# Patient Record
Sex: Female | Born: 1965 | Hispanic: Yes | Marital: Married | Smoking: Never smoker
Health system: Southern US, Community
[De-identification: ages and names within clinical notes are randomized; demographics above are authoritative.]

## PROBLEM LIST (undated history)

## (undated) DIAGNOSIS — N63 Unspecified lump in unspecified breast: Secondary | ICD-10-CM

## (undated) DIAGNOSIS — I10 Essential (primary) hypertension: Secondary | ICD-10-CM

## (undated) HISTORY — PX: BREAST EXCISIONAL BIOPSY: SUR124

## (undated) HISTORY — DX: Essential (primary) hypertension: I10

---

## 2011-07-27 HISTORY — PX: ABDOMINAL HYSTERECTOMY: SHX81

## 2015-11-25 ENCOUNTER — Ambulatory Visit (INDEPENDENT_AMBULATORY_CARE_PROVIDER_SITE_OTHER): Payer: Self-pay | Admitting: Emergency Medicine

## 2015-11-25 VITALS — BP 110/86 | HR 74 | Temp 98.1°F | Resp 6 | Ht 62.0 in | Wt 147.0 lb

## 2015-11-25 DIAGNOSIS — N898 Other specified noninflammatory disorders of vagina: Secondary | ICD-10-CM

## 2015-11-25 DIAGNOSIS — Z789 Other specified health status: Secondary | ICD-10-CM

## 2015-11-25 DIAGNOSIS — R1030 Lower abdominal pain, unspecified: Secondary | ICD-10-CM

## 2015-11-25 DIAGNOSIS — A5901 Trichomonal vulvovaginitis: Secondary | ICD-10-CM

## 2015-11-25 LAB — POCT URINALYSIS DIP (MANUAL ENTRY)
BILIRUBIN UA: NEGATIVE
Bilirubin, UA: NEGATIVE
GLUCOSE UA: NEGATIVE
Nitrite, UA: NEGATIVE
PH UA: 6
PROTEIN UA: NEGATIVE
Spec Grav, UA: <=1.005
Urobilinogen, UA: 0.2

## 2015-11-25 LAB — POCT WET + KOH PREP
TRICH BY WET PREP: ABSENT
YEAST BY KOH: ABSENT
YEAST BY WET PREP: ABSENT

## 2015-11-25 LAB — POCT URINE PREGNANCY: Preg Test, Ur: NEGATIVE

## 2015-11-25 NOTE — Progress Notes (Signed)
[facility] [address], [place] [address] [phone]  Date:  11/25/2015   Name:  Messiah Rovira   DOB:  March 14, 1966   MRN:  [id number]  PCP:  No PCP Per Patient    History of Present Illness:  Ellicia Alix is a 50 y.o. female patient who presents to [facility] for cc of abnormal vaginal discharge. Patient is here today with husband.  A teletranslator is used today. Patient complains of abnormal vaginal discharge for 1 week.  It is a yellow discharge.  It is pruritic.  No rash.  No odor.  She feels pain at her both sides of her lower abdomen.  Urine is yellow.  Clearer.  She has some back pain.  No change in appetite.  Last period is 02/08/2013.  She has no vaginal bleeding.     There are no active problems to display for this patient.   Past Medical History  Diagnosis Date  . Hypertension     History reviewed. No pertinent past surgical history.  Social History  Substance Use Topics  . Smoking status: Never Smoker   . Smokeless tobacco: None  . Alcohol Use: No    History reviewed. No pertinent family history.  No Known Allergies  Medication list has been reviewed and updated.  No current outpatient prescriptions on file prior to visit.   No current facility-administered medications on file prior to visit.    ROS ROS otherwise unremarkable unless listed above.  Physical Examination: BP 110/86 mmHg  Pulse 74  Temp(Src) 98.1 F (36.7 C) (Oral)  Resp 6  Ht  (1.575 m)  Wt 147 lb (66.679 kg)  BMI 26.88 kg/m2  SpO2 99% Ideal Body Weight: Weight in (lb) to have BMI = 25: 136.4  Physical Exam  Constitutional: She is oriented to person, place, and time. She appears well-developed and well-nourished. No distress.  HENT:  Head: Normocephalic and atraumatic.  Right Ear: External ear normal.  Left Ear: External ear normal.  Eyes: Conjunctivae and EOM are normal. Pupils are equal, round, and reactive to light.  Cardiovascular: Normal  rate.   Pulmonary/Chest: Effort normal. No respiratory distress.  Genitourinary: Pelvic exam was performed with patient supine. Right adnexum displays no mass and no tenderness. Left adnexum displays no mass and no tenderness. No erythema in the vagina. Vaginal discharge (Yellow) found.  There is a cuffing towards right side consistent with a hysterectomy and removal of cervix at the left side at the end of the vaginal canal.However, this specific area is friable and quite possibly a cervix as cervical bordering is ill defined, and transitions into rugae diminished consistent with atrophic vaginitis.  Neurological: She is alert and oriented to person, place, and time.  Skin: Skin is warm and dry. She is not diaphoretic.  Psychiatric: She has a normal mood and affect. Her behavior is normal.   Results for orders placed or performed in visit on 11/25/15  POCT Wet + KOH Prep  Result Value Ref Range   Yeast by KOH Absent Present, Absent   Yeast by wet prep Absent Present, Absent   WBC by wet prep Too numerous to count  None, Few, Too numerous to count   Clue Cells Wet Prep HPF POC None None, Too numerous to count   Trich by wet prep Absent Present, Absent   Bacteria Wet Prep HPF POC Few None, Few, Too numerous to count   Epithelial Cells By [employer] Pref (UMFC) Moderate (A) None,  Few, Too numerous to count   RBC,UR,HPF,POC None None RBC/hpf  POCT urinalysis dipstick  Result Value Ref Range   Color, UA light yellow (A) yellow   Clarity, UA hazy (A) clear   Glucose, UA negative negative   Bilirubin, UA negative negative   Ketones, POC UA negative negative   Spec Grav, UA <=1.005    Blood, UA trace-intact (A) negative   pH, UA 6.0    Protein Ur, POC negative negative   Urobilinogen, UA 0.2    Nitrite, UA Negative Negative   Leukocytes, UA small (1+) (A) Negative  POCT urine pregnancy  Result Value Ref Range   Preg Test, Ur Negative Negative     Assessment and Plan: Galvin ProfferMaria Vallecillo is a  50 y.o. female who is here today for chief complaint of vaginal discharge abnormality. We have ordered a Pap this time.  Lower abdominal pain - Plan: Pap IG, CT/NG w/ reflex HPV when ASC-U, POCT urine pregnancy  Vaginal discharge - Plan: POCT Wet + KOH Prep, POCT urinalysis dipstick, CANCELED: Pap IG w/ reflex to HPV when ASC-U  Language barrier  Trena PlattStephanie English, PA-C [facility]  Addendum: Teletranslator utilized to place a message of positive trichomoniasis from pap smear.  Advised of dosage, partner testing, and abstaining for at least 1 week after treatment and that partner must be treated.   I stated that their are odd cases where this pathogen may lay dormant for a lengthy time prior to symptoms.  Advised to contact for questions. Pap is also satisfactor for endocervical sample, which possibly indicates remnants of cervix at excision site, or this was in fact, her cervix.

## 2015-11-25 NOTE — Patient Instructions (Addendum)
I will be in contact with you in regards to your results.   We recommend that you schedule a mammogram for breast cancer screening. Typically, you do not need a referral to do this. Please contact a local imaging center to schedule your mammogram.  [facility] - [phone]  *ask for the Radiology Department [facility]) - [phone] or [phone]  [facility] - [phone] [facility] - [phone] [facility] - [phone]  *ask for the Radiology Department [facility] - [phone]  *ask for the Radiology Department [facility] - [phone]  *ask for the Mammography Department [facility] - [phone]   We recommend that you schedule a mammogram for breast cancer screening. Typically, you do not need a referral to do this. Please contact a local imaging center to schedule your mammogram.  [facility] - [phone]  *ask for the Radiology Department [facility]) - [phone] or [phone]  [facility] - [phone] [facility] - [phone] [facility] - [phone]  *ask for the Radiology Department [facility] - [phone]) [phone]  *ask for the Radiology Department [facility] - [phone]  *ask for the Mammography Department [facility] - [phone]

## 2015-11-27 LAB — PAP IG, CT-NG, RFX HPV ASCU
CHLAMYDIA PROBE AMP: NOT DETECTED
GC PROBE AMP: NOT DETECTED

## 2015-11-27 MED ORDER — METRONIDAZOLE 500 MG PO TABS: 1000.0000 mg | ORAL_TABLET | Freq: Two times a day (BID) | ORAL | Status: AC

## 2016-08-03 LAB — HM MAMMOGRAPHY

## 2017-05-24 ENCOUNTER — Encounter: Payer: Self-pay | Admitting: Family Medicine

## 2017-05-24 ENCOUNTER — Ambulatory Visit (INDEPENDENT_AMBULATORY_CARE_PROVIDER_SITE_OTHER): Payer: Self-pay | Admitting: Family Medicine

## 2017-05-24 VITALS — BP 122/76 | HR 82 | Temp 98.5°F | Resp 17 | Ht 62.0 in | Wt 142.6 lb

## 2017-05-24 DIAGNOSIS — I10 Essential (primary) hypertension: Secondary | ICD-10-CM | POA: Insufficient documentation

## 2017-05-24 MED ORDER — AMLODIPINE BESYLATE 5 MG PO TABS: 5.0000 mg | ORAL_TABLET | Freq: Every day | ORAL | 11 refills | Status: DC

## 2017-05-24 NOTE — Patient Instructions (Signed)
1. empezar con media tableta una vez al dia 2. venir en 2 semanas a chequearse la presion con el enfermero 3. si la presion esta > 140/90, entonces tomarse una pastilla al dia

## 2017-05-24 NOTE — Progress Notes (Signed)
   10/30/20184:39 PM  Linda ProfferMaria Henry Sep 16, 1965, 51 y.o. female [id number]  Chief Complaint  Patient presents with  . Medication Refill    lisinopril    HPI:   Patient is a 51 y.o. female with past medical history significant for HTN who presents today for med refill.  She used to take hctz 12.5mg  however this was changed to lisinorpil 10mg  due to hypokalemia  She is wondering if current dry cough this from lisinopril, as she normally does not have issues with cough, denies any URI or allergy sx.   [facility] 2/9 05/24/2017 11/25/2015  Decreased Interest 0 0  Down, Depressed, Hopeless 0 0  PHQ - 2 Score 0 0    No Known Allergies  Prior to Admission medications   Medication Sig Start Date End Date Taking? Authorizing Provider  Lisinopril 10mg  Take 1 tablet (10 mg total) by mouth daily. 05/24/17   Myles LippsSantiago, Aundray Cartlidge M, MD    Past Medical History:  Diagnosis Date  . Hypertension     Past Surgical History:  Procedure Laterality Date  . ABDOMINAL HYSTERECTOMY  2013   partial for benign resons, ovaries not removed, [place]    Social History  Substance Use Topics  . Smoking status: Never Smoker  . Smokeless tobacco: Never Used  . Alcohol use No    History reviewed. No pertinent family history.  Review of Systems  Constitutional: Negative for chills and fever.  Respiratory: Negative for cough and shortness of breath.   Cardiovascular: Negative for chest pain, palpitations and leg swelling.  Gastrointestinal: Negative for abdominal pain, nausea and vomiting.     OBJECTIVE:  Blood pressure 122/76, pulse 82, temperature 98.5 F (36.9 C), temperature source Oral, resp. rate 17, height 5\' 2"  (1.575 m), weight 142 lb 9.6 oz (64.7 kg), SpO2 100 %.  Physical Exam  Constitutional: She is oriented to person, place, and time and well-developed, well-nourished, and in no distress.  HENT:  Head: Normocephalic and atraumatic.  Mouth/Throat: Oropharynx is clear  and moist. No oropharyngeal exudate.  Eyes: Pupils are equal, round, and reactive to light. EOM are normal. No scleral icterus.  Neck: Neck supple.  Cardiovascular: Normal rate, regular rhythm and normal heart sounds.  Exam reveals no gallop and no friction rub.   No murmur heard. Pulmonary/Chest: Effort normal and breath sounds normal. She has no wheezes. She has no rales.  Musculoskeletal: She exhibits no edema.  Neurological: She is alert and oriented to person, place, and time. Gait normal.  Skin: Skin is warm and dry.     ASSESSMENT and PLAN  1. Essential hypertension,  DC lisinopril as cough might be ACE induced. Start amlodipine at 1/2 tab (2.5mg ) once a day, recheck BP in 2 weeks with nurse visit. If BP > 140/90 then increase to 1 tab (5mg ) once a day. New med r/se/b reviewed. Coupon for [employer] given. Hand rx given. RTC precautions discussed. - Basic metabolic panel - amLODipine (NORVASC) 5 MG tablet; Take 1 tablet (5 mg total) by mouth daily.  No Follow-up on file.    Myles LippsIrma M Santiago, MD [facility] at [facility] [address] [place], [place] [address] Ph.  [phone] Fax [phone]

## 2017-05-25 LAB — BASIC METABOLIC PANEL
BUN/Creatinine Ratio: 13 (ref 9–23)
BUN: 11 mg/dL (ref 6–24)
CO2: 23 mmol/L (ref 20–29)
Calcium: 9 mg/dL (ref 8.7–10.2)
Chloride: 103 mmol/L (ref 96–106)
Creatinine, Ser: 0.87 mg/dL (ref 0.57–1.00)
GFR calc Af Amer: 89 mL/min/{1.73_m2} (ref >59)
GFR calc non Af Amer: 77 mL/min/{1.73_m2} (ref >59)
Glucose: 89 mg/dL (ref 65–99)
Potassium: 4 mmol/L (ref 3.5–5.2)
Sodium: 141 mmol/L (ref 134–144)

## 2017-07-26 DIAGNOSIS — N63 Unspecified lump in unspecified breast: Secondary | ICD-10-CM

## 2017-07-26 HISTORY — DX: Unspecified lump in unspecified breast: N63.0

## 2017-08-23 ENCOUNTER — Encounter: Payer: Self-pay | Admitting: Family Medicine

## 2017-08-23 ENCOUNTER — Ambulatory Visit (INDEPENDENT_AMBULATORY_CARE_PROVIDER_SITE_OTHER): Payer: 59 | Admitting: Family Medicine

## 2017-08-23 ENCOUNTER — Other Ambulatory Visit: Payer: Self-pay

## 2017-08-23 VITALS — BP 126/70 | HR 64 | Temp 98.2°F | Ht 62.0 in | Wt 145.0 lb

## 2017-08-23 DIAGNOSIS — I10 Essential (primary) hypertension: Secondary | ICD-10-CM

## 2017-08-23 DIAGNOSIS — Z23 Encounter for immunization: Secondary | ICD-10-CM | POA: Diagnosis not present

## 2017-08-23 DIAGNOSIS — Z1231 Encounter for screening mammogram for malignant neoplasm of breast: Secondary | ICD-10-CM

## 2017-08-23 MED ORDER — LISINOPRIL 10 MG PO TABS: 10.0000 mg | ORAL_TABLET | Freq: Every day | ORAL | 3 refills | Status: DC

## 2017-08-23 NOTE — Progress Notes (Signed)
   1/29/20199:04 AM  Galvin ProfferMaria Vallecillo 15-Jul-1966, 52 y.o. female [id number]  Chief Complaint  Patient presents with  . Follow-up    BP FOLLOW UP    HPI:   Patient is a 52 y.o. female with past medical history significant for HTN who presents today for BP check  Cough did not resolve after stopping lisinopril, she would like to return to lisinopril as her BP was well controlled on it in the past and medication is cheaper  She is also requesting referral for mammogram Last mammo a year ago, in [place], denies abnormal, denies any breast lumps of nipple discharge Her sister was recently diagnosed with breast cancer  [facility] 08/23/2017 05/24/2017 11/25/2015  Decreased Interest 0 0 0  Down, Depressed, Hopeless 0 0 0  PHQ - 2 Score 0 0 0    No Known Allergies  Prior to Admission medications   Medication Sig Start Date End Date Taking? Authorizing Provider  amLODipine (NORVASC) 5 MG tablet Take 1 tablet (5 mg total) by mouth daily. 05/24/17  Yes Myles LippsSantiago, Raelin Pixler M, MD    Past Medical History:  Diagnosis Date  . Hypertension     Past Surgical History:  Procedure Laterality Date  . ABDOMINAL HYSTERECTOMY  2013   partial for benign resons, ovaries not removed, [place]    Social History   Tobacco Use  . Smoking status: Never Smoker  . Smokeless tobacco: Never Used  Substance Use Topics  . Alcohol use: No    Alcohol/week: 0.0 oz    Family History  Problem Relation Age of Onset  . Healthy Mother   . Healthy Father     ROS Per hpi  OBJECTIVE:  Blood pressure 126/70, pulse 64, temperature 98.2 F (36.8 C), temperature source Oral, height 5\' 2"  (1.575 m), weight 145 lb (65.8 kg), SpO2 97 %.  Physical Exam  Constitutional: She is oriented to person, place, and time and well-developed, well-nourished, and in no distress.  HENT:  Head: Normocephalic and atraumatic.  Mouth/Throat: Mucous membranes are normal.  Eyes: EOM are normal.  Pupils are equal, round, and reactive to light. No scleral icterus.  Neck: Neck supple.  Pulmonary/Chest: Effort normal.  Neurological: She is alert and oriented to person, place, and time. Gait normal.  Skin: Skin is warm and dry.  Psychiatric: Mood and affect normal.  Nursing note and vitals reviewed.  Bmet, 04/2017, normal   ASSESSMENT and PLAN  1. Essential hypertension, benign Patient is well controlled, changing back to lisinopril per patient's request.    2. Visit for screening mammogram - MM DIGITAL SCREENING BILATERAL; Future  3. Need for prophylactic vaccination with combined diphtheria-tetanus-pertussis (DTP) vaccine - Td vaccine greater than or equal to 7yo preservative free IM  4. Need for prophylactic vaccination and inoculation against influenza - Flu Vaccine QUAD 36+ mos IM  Other orders - lisinopril (PRINIVIL,ZESTRIL) 10 MG tablet; Take 1 tablet (10 mg total) by mouth daily.  Return in about 6 months (around 02/20/2018).    Myles LippsIrma M Santiago, MD [facility] at [facility] [address] [place], [place] [address] Ph.  [phone] Fax [phone]

## 2017-08-23 NOTE — Patient Instructions (Signed)
     IF you received an x-ray today, you will receive an invoice from [facility]. Please contact [facility] at [phone] with questions or concerns regarding your invoice.   IF you received labwork today, you will receive an invoice from [facility]. Please contact [facility] at [phone] with questions or concerns regarding your invoice.   Our billing staff will not be able to assist you with questions regarding bills from these companies.  You will be contacted with the lab results as soon as they are available. The fastest way to get your results is to activate your My Chart account. Instructions are located on the last page of this paperwork. If you have not heard from us regarding the results in 2 weeks, please contact this office.     

## 2018-02-10 ENCOUNTER — Ambulatory Visit: Payer: 59 | Admitting: Family Medicine

## 2018-02-14 ENCOUNTER — Other Ambulatory Visit: Payer: Self-pay

## 2018-02-14 ENCOUNTER — Ambulatory Visit: Payer: BLUE CROSS/BLUE SHIELD | Admitting: Family Medicine

## 2018-02-14 ENCOUNTER — Encounter: Payer: Self-pay | Admitting: Family Medicine

## 2018-02-14 ENCOUNTER — Ambulatory Visit: Payer: 59 | Admitting: Family Medicine

## 2018-02-14 VITALS — BP 112/72 | HR 83 | Temp 99.1°F | Resp 18 | Ht 62.0 in | Wt 142.0 lb

## 2018-02-14 DIAGNOSIS — Z13220 Encounter for screening for lipoid disorders: Secondary | ICD-10-CM | POA: Diagnosis not present

## 2018-02-14 DIAGNOSIS — Z130 Encounter for screening for diseases of the blood and blood-forming organs and certain disorders involving the immune mechanism: Secondary | ICD-10-CM

## 2018-02-14 DIAGNOSIS — Z0000 Encounter for general adult medical examination without abnormal findings: Secondary | ICD-10-CM | POA: Diagnosis not present

## 2018-02-14 DIAGNOSIS — Z13228 Encounter for screening for other metabolic disorders: Secondary | ICD-10-CM | POA: Diagnosis not present

## 2018-02-14 DIAGNOSIS — Z1231 Encounter for screening mammogram for malignant neoplasm of breast: Secondary | ICD-10-CM

## 2018-02-14 DIAGNOSIS — Z119 Encounter for screening for infectious and parasitic diseases, unspecified: Secondary | ICD-10-CM

## 2018-02-14 DIAGNOSIS — Z1211 Encounter for screening for malignant neoplasm of colon: Secondary | ICD-10-CM

## 2018-02-14 DIAGNOSIS — Z1329 Encounter for screening for other suspected endocrine disorder: Secondary | ICD-10-CM

## 2018-02-14 MED ORDER — LISINOPRIL 10 MG PO TABS: 10.0000 mg | ORAL_TABLET | Freq: Every day | ORAL | 3 refills | Status: DC

## 2018-02-14 NOTE — Progress Notes (Signed)
7/23/20192:17 PM  Linda Henry 1966-05-03, 52 y.o. female [id number]  Chief Complaint  Patient presents with  . Breast Problem    follow up     HPI:   Patient is a 52 y.o. female with past medical history significant for HTN who presents today for CPE  Cervical Cancer Screening: supracervical hyst, last pap 11/2015, negative Breast Cancer Screening: 07/2016, birads 1, done in [place], brings report for review Colorectal Cancer Screening: referred today, denies fhx Bone Density Testing: at age 81 HIV Screening: done today Seasonal Influenza Vaccination: will need this season Td/Tdap Vaccination: given jan 2019 Pneumococcal Vaccination: at age 54 Zoster Vaccination: declines today Frequency of Dental evaluation: does in [place] Frequency of Eye evaluation: does in [place]  Fall Risk  08/23/2017 05/24/2017 11/25/2015  Falls in the past year? No No No     Depression screen [facility] 2/9 08/23/2017 05/24/2017 11/25/2015  Decreased Interest 0 0 0  Down, Depressed, Hopeless 0 0 0  PHQ - 2 Score 0 0 0    No Known Allergies  Prior to Admission medications   Medication Sig Start Date End Date Taking? Authorizing Provider  lisinopril (PRINIVIL,ZESTRIL) 10 MG tablet Take 1 tablet (10 mg total) by mouth daily. 08/23/17  Yes Myles Lipps, MD    Past Medical History:  Diagnosis Date  . Hypertension     Past Surgical History:  Procedure Laterality Date  . ABDOMINAL HYSTERECTOMY  2013   partial, for benign resons, cervix and ovaries not removed, [place]    Social History   Tobacco Use  . Smoking status: Never Smoker  . Smokeless tobacco: Never Used  Substance Use Topics  . Alcohol use: No    Alcohol/week: 0.0 oz    Family History  Problem Relation Age of Onset  . Healthy Mother   . Healthy Father     Review of Systems  Constitutional: Negative for chills and fever.  Respiratory: Negative for cough and shortness of breath.   Cardiovascular: Negative for chest  pain, palpitations and leg swelling.  Gastrointestinal: Negative for abdominal pain, nausea and vomiting.  Genitourinary: Negative for dysuria and hematuria.       Neg breast lumps or nipple discharge   Musculoskeletal: Negative for falls and myalgias.  All other systems reviewed and are negative.    OBJECTIVE:  Blood pressure 112/72, pulse 83, temperature 99.1 F (37.3 C), temperature source Oral, resp. rate 18, height 5\' 2"  (1.575 m), weight 142 lb (64.4 kg), SpO2 99 %. Body mass index is 25.97 kg/m.    Visual Acuity Screening   Right eye Left eye Both eyes  Without correction: 20/25 20/25 20/20   With correction:       Physical Exam  Constitutional: She is oriented to person, place, and time. She appears well-developed and well-nourished.  HENT:  Head: Normocephalic and atraumatic.  Right Ear: Hearing, tympanic membrane, external ear and ear canal normal.  Left Ear: Hearing, tympanic membrane, external ear and ear canal normal.  Mouth/Throat: Oropharynx is clear and moist.  Eyes: Pupils are equal, round, and reactive to light. Conjunctivae and EOM are normal.  Neck: Neck supple. No thyromegaly present.  Cardiovascular: Normal rate, regular rhythm, normal heart sounds and intact distal pulses. Exam reveals no gallop and no friction rub.  No murmur heard. Pulmonary/Chest: Effort normal and breath sounds normal. She has no wheezes. She has no rales. Right breast exhibits no inverted nipple, no mass, no nipple discharge and no skin change. Left breast exhibits no  inverted nipple, no mass, no nipple discharge and no skin change. Breasts are symmetrical.  Abdominal: Soft. Bowel sounds are normal. She exhibits no distension and no mass. There is no tenderness.  Musculoskeletal: Normal range of motion. She exhibits no edema.  Lymphadenopathy:    She has no cervical adenopathy.    She has no axillary adenopathy.       Right: No supraclavicular adenopathy present.       Left: No  supraclavicular adenopathy present.  Neurological: She is alert and oriented to person, place, and time. She has normal reflexes. No cranial nerve deficit. Gait normal.  Skin: Skin is warm and dry.  Psychiatric: She has a normal mood and affect.  Nursing note and vitals reviewed.   ASSESSMENT and PLAN  1. Routine general medical examination at a health care facility No concerns per history or exam. Routine HCM labs ordered. HCM reviewed/discussed. Anticipatory guidance regarding healthy weight, lifestyle and choices given.   2. Visit for screening mammogram - Mammogram Digital Screening; Future  3. Screening for colon cancer - Ambulatory referral to Gastroenterology  4. Screening for deficiency anemia - CBC  5. Screening for lipid disorders - Lipid panel  6. Screening for thyroid disorder - TSH  7. Screening for metabolic disorder - Comprehensive metabolic panel  8. Screening examination for infectious disease - HIV antibody (with reflex)  Other orders - lisinopril (PRINIVIL,ZESTRIL) 10 MG tablet; Take 1 tablet (10 mg total) by mouth daily.  Return in 6 months (on 08/17/2018).    Myles LippsIrma M Santiago, MD [facility] Care at [facility] [address] [place], [place] [address] Ph.  [phone] Fax [phone]

## 2018-02-14 NOTE — Patient Instructions (Addendum)
   IF you received an x-ray today, you will receive an invoice from [facility]. Please contact [facility] at [phone] with questions or concerns regarding your invoice.   IF you received labwork today, you will receive an invoice from [facility]. Please contact [facility] at [phone] with questions or concerns regarding your invoice.   Our billing staff will not be able to assist you with questions regarding bills from these companies.  You will be contacted with the lab results as soon as they are available. The fastest way to get your results is to activate your My Chart account. Instructions are located on the last page of this paperwork. If you have not heard from us regarding the results in 2 weeks, please contact this office.     Mantenimiento de la salud - Mujeres (Health Maintenance, Female) Un estilo de vida saludable y los cuidados preventivos pueden favorecer considerablemente a la salud y el bienestar. Pregunte a su mdico cul es el cronograma de exmenes peridicos apropiado para usted. Esta es una buena oportunidad para consultarlo sobre cmo prevenir enfermedades y mantenerse sano. Adems de los controles, hay muchas otras cosas que puede hacer usted mismo. Los expertos han realizado numerosas investigaciones sobre los cambios en el estilo de vida y las medidas de prevencin que, muy probablemente, lo ayudarn a mantenerse sano. Solicite a su mdico ms informacin. EL PESO Y LA DIETA Consuma una dieta saludable.  Asegrese de incluir muchas verduras, frutas, productos lcteos de bajo contenido de grasa y protenas magras.  No consuma muchos alimentos de alto contenido de grasas slidas, azcares agregados o sal.  Realice actividad fsica con regularidad. Esta es una de las prcticas ms importantes que puede hacer por su salud. ? La mayora de los adultos deben hacer ejercicio durante al menos 150minutos por semana. El ejercicio debe aumentar la  frecuencia cardaca y provocar la transpiracin (ejercicio de intensidad moderada). ? La mayora de los adultos tambin deben hacer ejercicios de elongacin al menos dos veces a la semana. Agregue esto al su plan de ejercicio de intensidad moderada. Mantenga un peso saludable.  El ndice de masa corporal (IMC) es una medida que puede utilizarse para identificar posibles problemas de peso. Proporciona una estimacin de la grasa corporal basndose en el peso y la altura. Su mdico puede ayudarle a determinar su IMC y a lograr o mantener un peso saludable.  Para las mujeres de 20aos o ms: ? Un IMC menor de 18,5 se considera bajo peso. ? Un IMC entre 18,5 y 24,9 es normal. ? Un IMC entre 25 y 29,9 se considera sobrepeso. ? Un IMC de 30 o ms se considera obesidad. Observe los niveles de colesterol y lpidos en la sangre.  Debe comenzar a realizarse anlisis de lpidos y colesterol en la sangre a los 20aos y luego repetirlos cada 5aos.  Es posible que necesite controlar los niveles de colesterol con mayor frecuencia si: ? Sus niveles de lpidos y colesterol son altos. ? Es mayor de 50aos. ? Presenta un alto riesgo de padecer enfermedades cardacas. DETECCIN DE CNCER Cncer de pulmn  Se recomienda realizar exmenes de deteccin de cncer de pulmn a personas adultas entre 55 y 80 aos que estn en riesgo de desarrollar cncer de pulmn por sus antecedentes de consumo de tabaco.  Se recomienda una tomografa computarizada de baja dosis de los pulmones todos los aos a las personas que: ? Fuman actualmente. ? Hayan dejado el hbito en algn momento en los ltimos 15aos. ?   Hayan fumado durante 30aos un paquete diario. Un paquete-ao equivale a fumar un promedio de un paquete de cigarrillos diario durante un ao.  Los exmenes de deteccin anuales deben continuar hasta que hayan pasado 15aos desde que dej de fumar.  Ya no debern realizarse si tiene un problema de salud que le  impida recibir tratamiento para el cncer de pulmn. Cncer de mama  Practique la autoconciencia de la mama. Esto significa reconocer la apariencia normal de sus mamas y cmo las siente.  Tambin significa realizar autoexmenes regulares de las mamas. Informe a su mdico sobre cualquier cambio, sin importar cun pequeo sea.  Si tiene entre 20 y 30 aos, un mdico debe realizarle un examen clnico de las mamas como parte del examen regular de salud, cada 1 a 3aos.  Si tiene 40aos o ms, debe realizarse un examen clnico de las mamas todos los aos. Tambin considere realizarse una radiografa de las mamas (mamografa) todos los aos.  Si tiene antecedentes familiares de cncer de mama, hable con su mdico para someterse a un estudio gentico.  Si tiene alto riesgo de padecer cncer de mama, hable con su mdico para someterse a una resonancia magntica y una mamografa todos los aos.  La evaluacin del gen del cncer de mama (BRCA) se recomienda a mujeres que tengan familiares con cnceres relacionados con el BRCA. Los cnceres relacionados con el BRCA incluyen los siguientes: ? Mama. ? Ovario. ? Trompas. ? Cnceres de peritoneo.  Los resultados de la evaluacin determinarn la necesidad de asesoramiento gentico y de anlisis de BRCA1 y BRCA2. Cncer de cuello del tero El mdico puede recomendarle que se haga pruebas peridicas de deteccin de cncer de los rganos de la pelvis (ovarios, tero y vagina). Estas pruebas incluyen un examen plvico, que abarca controlar si se produjeron cambios microscpicos en la superficie del cuello del tero (prueba de Papanicolaou). Pueden recomendarle que se haga estas pruebas cada 3aos, a partir de los 21aos.  A las mujeres que tienen entre 30 y 65aos, los mdicos pueden recomendarles que se sometan a exmenes plvicos y pruebas de Papanicolaou cada 3aos, o a la prueba de Papanicolaou y el examen plvico en combinacin con estudios de  deteccin del virus del papiloma humano (VPH) cada 5aos. Algunos tipos de VPH aumentan el riesgo de padecer cncer de cuello del tero. La prueba para la deteccin del VPH tambin puede realizarse a mujeres de cualquier edad cuyos resultados de la prueba de Papanicolaou no sean claros.  Es posible que otros mdicos no recomienden exmenes de deteccin a mujeres no embarazadas que se consideran sujetos de bajo riesgo de padecer cncer de pelvis y que no tienen sntomas. Pregntele al mdico si un examen plvico de deteccin es adecuado para usted.  Si ha recibido un tratamiento para el cncer cervical o una enfermedad que podra causar cncer, necesitar realizarse una prueba de Papanicolaou y controles durante al menos 20 aos de concluido el tratamiento. Si no se ha hecho el Papanicolaou con regularidad, debern volver a evaluarse los factores de riesgo (como tener un nuevo compaero sexual), para determinar si debe realizarse los estudios nuevamente. Algunas mujeres sufren problemas mdicos que aumentan la probabilidad de contraer cncer de cuello del tero. En estos casos, el mdico podr indicar que se realicen controles y pruebas de Papanicolaou con ms frecuencia. Cncer colorrectal  Este tipo de cncer puede detectarse y a menudo prevenirse.  Por lo general, los estudios de rutina se deben comenzar a hacer a partir de   los 50 aos y [facility] 33 aos.  Sin embargo, el mdico podr aconsejarle que lo haga antes, si tiene factores de riesgo para el cncer de colon.  Tambin puede recomendarle que use un kit de prueba para Hydrologist en la materia fecal.  Es posible que se use una pequea cmara en el extremo de un tubo para examinar directamente el colon (sigmoidoscopia o colonoscopia) a fin de Hydrographic surveyor formas tempranas de cncer colorrectal.  Los exmenes de rutina generalmente comienzan a los 87aos.  El examen directo del colon se debe repetir cada 5 a 10aos hasta los  75aos. Sin embargo, es posible que se realicen exmenes con mayor frecuencia, si se detectan formas tempranas de plipos precancerosos o pequeos bultos. Cncer de piel  Revise la piel de la cabeza a los pies con regularidad.  Informe a su mdico si aparecen nuevos lunares o los que tiene se modifican, especialmente en su forma y color.  Tambin notifique al mdico si tiene un lunar que es ms grande que el tamao de una goma de lpiz.  Siempre use pantalla solar. Aplique pantalla solar de Kerry Dory y repetida a lo largo del Training and development officer.  Protjase usando mangas y [employer], un sombrero de ala ancha y gafas para el sol, siempre que se encuentre en el exterior. ENFERMEDADES CARDACAS, DIABETES E HIPERTENSIN ARTERIAL  La hipertensin arterial causa enfermedades cardacas y [place] el riesgo de ictus. La hipertensin arterial es ms probable en los siguientes casos: ? Las personas que tienen la presin arterial en el extremo del rango normal (100-139/85-89 mm Hg). ? [employer] con sobrepeso u obesidad. ? Scientist, water quality.  Si usted tiene entre 18 y 39 aos, debe medirse la presin arterial cada 3 a 5 aos. Si usted tiene 40 aos o ms, debe medirse la presin arterial [employer]. Debe medirse la presin arterial dos veces: una vez cuando est en un hospital o una clnica y la otra vez cuando est en otro sitio. Registre el promedio de [employer]. Para controlar su presin arterial cuando no est en un hospital o [place], puede usar lo siguiente: ? Jorje Guild automtica para medir la presin arterial en una farmacia. ? Un monitor para medir la presin arterial en el hogar.  Si tiene entre 82 y 34 aos, consulte a su mdico si debe tomar aspirina para prevenir el ictus.  Realcese exmenes de deteccin de la diabetes con regularidad. Esto incluye la toma de [place] de sangre para controlar el nivel de azcar en la sangre durante el [place]. ? Si tiene un  peso normal y un bajo riesgo de padecer diabetes, realcese este anlisis cada tres aos despus de los 45aos. ? Si tiene sobrepeso y un alto riesgo de padecer diabetes, considere someterse a este anlisis antes o con mayor frecuencia. PREVENCIN DE INFECCIONES HepatitisB  Si tiene un riesgo ms alto de Museum/gallery curator hepatitis B, debe someterse a un examen de deteccin de este virus. Se considera que tiene un alto riesgo de contraer hepatitis B si: ? Naci en un pas donde la hepatitis B es frecuente. Pregntele a su mdico qu pases son considerados de Public affairs consultant. ? Sus padres nacieron en un pas de alto riesgo y usted no recibi una vacuna que lo proteja contra la hepatitis B (vacuna contra la hepatitis B). ? [place]. ? [place] agujas para inyectarse drogas. ? Vive con alguien que tiene hepatitis B. ? Ha tenido sexo con alguien  que tiene hepatitis B. ? Recibe tratamiento de hemodilisis. ? Toma ciertos medicamentos para el cncer, trasplante de rganos y afecciones autoinmunitarias. Hepatitis C  Se recomienda un anlisis de sangre para: ? Todos los que nacieron entre 1945 y 1965. ? Todas las personas que tengan un riesgo de haber contrado hepatitis C. Enfermedades de transmisin sexual (ETS).  Debe realizarse pruebas de deteccin de enfermedades de transmisin sexual (ETS), incluidas gonorrea y clamidia si: ? Es sexualmente activo y es menor de 24aos. ? Es mayor de 24aos, y el mdico le informa que corre riesgo de tener este tipo de infecciones. ? La actividad sexual ha cambiado desde que le hicieron la ltima prueba de deteccin y tiene un riesgo mayor de tener clamidia o gonorrea. Pregntele al mdico si usted tiene riesgo.  Si no tiene el VIH, pero corre riesgo de infectarse por el virus, se recomienda tomar diariamente un medicamento recetado para evitar la infeccin. Esto se conoce como profilaxis previa a la exposicin. Se considera que est en riesgo si: ? Es activo  sexualmente y no usa preservativos habitualmente o no conoce el estado del VIH de sus parejas sexuales. ? Se inyecta drogas. ? Es activo sexualmente con una pareja que tiene VIH. Consulte a su mdico para saber si tiene un alto riesgo de infectarse por el VIH. Si opta por comenzar la profilaxis previa a la exposicin, primero debe realizarse anlisis de deteccin del VIH. Luego, le harn anlisis cada 3meses mientras est tomando los medicamentos para la profilaxis previa a la exposicin. EMBARAZO  Si es premenopusica y puede quedar embarazada, solicite a su mdico asesoramiento previo a la concepcin.  Si puede quedar embarazada, tome 400 a 800microgramos (mcg) de cido flico todos los das.  Si desea evitar el embarazo, hable con su mdico sobre el control de la natalidad (anticoncepcin). OSTEOPOROSIS Y MENOPAUSIA  La osteoporosis es una enfermedad en la que los huesos pierden los minerales y la fuerza por el avance de la edad. El resultado pueden ser fracturas graves en los huesos. El riesgo de osteoporosis puede identificarse con una prueba de densidad sea.  Si tiene 65aos o ms, o si est en riesgo de sufrir osteoporosis y fracturas, pregunte a su mdico si debe someterse a exmenes.  Consulte a su mdico si debe tomar un suplemento de calcio o de vitamina D para reducir el riesgo de osteoporosis.  La menopausia puede presentar ciertos sntomas fsicos y riesgos.  La terapia de reemplazo hormonal puede reducir algunos de estos sntomas y riesgos. Consulte a su mdico para saber si la terapia de reemplazo hormonal es conveniente para usted. INSTRUCCIONES PARA EL CUIDADO EN EL HOGAR  Realcese los estudios de rutina de la salud, dentales y de la vista.  Mantngase al da con las vacunas.  No consuma ningn producto que contenga tabaco, lo que incluye cigarrillos, tabaco de mascar o cigarrillos electrnicos.  Si est embarazada, no beba alcohol.  Si est amamantando,  reduzca el consumo de alcohol y la frecuencia con la que consume.  Si es mujer y no est embarazada limite el consumo de alcohol a no ms de 1 medida por da. Una medida equivale a 12onzas de cerveza, 5onzas de vino o 1onzas de bebidas alcohlicas de alta graduacin.  No consuma drogas.  No comparta agujas.  Solicite ayuda a su mdico si necesita apoyo o informacin para abandonar las drogas.  Informe a su mdico si a menudo se siente deprimido.  Notifique a su mdico si alguna vez   ha sido vctima de abuso o si no se siente Scientist, forensic. Esta informacin no tiene Marine scientist el consejo del mdico. Asegrese de hacerle al mdico cualquier pregunta que tenga. Document Released: 07/01/2011 Document Revised: 08/02/2014 Document Reviewed: 04/15/2015 Elsevier Interactive Patient Education  [employer].

## 2018-02-15 LAB — COMPREHENSIVE METABOLIC PANEL
ALT: 10 IU/L (ref 0–32)
AST: 13 IU/L (ref 0–40)
Albumin/Globulin Ratio: 1.7 (ref 1.2–2.2)
Albumin: 4.3 g/dL (ref 3.5–5.5)
Alkaline Phosphatase: 52 IU/L (ref 39–117)
BUN/Creatinine Ratio: 14 (ref 9–23)
BUN: 10 mg/dL (ref 6–24)
Bilirubin Total: 0.4 mg/dL (ref 0.0–1.2)
CO2: 22 mmol/L (ref 20–29)
Calcium: 9 mg/dL (ref 8.7–10.2)
Chloride: 107 mmol/L — ABNORMAL HIGH (ref 96–106)
Creatinine, Ser: 0.73 mg/dL (ref 0.57–1.00)
GFR calc Af Amer: 109 mL/min/{1.73_m2} (ref >59)
GFR calc non Af Amer: 94 mL/min/{1.73_m2} (ref >59)
Globulin, Total: 2.6 g/dL (ref 1.5–4.5)
Glucose: 88 mg/dL (ref 65–99)
Potassium: 4.1 mmol/L (ref 3.5–5.2)
Sodium: 142 mmol/L (ref 134–144)
Total Protein: 6.9 g/dL (ref 6.0–8.5)

## 2018-02-15 LAB — LIPID PANEL
Chol/HDL Ratio: 4.9 ratio — ABNORMAL HIGH (ref 0.0–4.4)
Cholesterol, Total: 157 mg/dL (ref 100–199)
HDL: 32 mg/dL — ABNORMAL LOW (ref >39)
LDL Calculated: 98 mg/dL (ref 0–99)
Triglycerides: 134 mg/dL (ref 0–149)
VLDL Cholesterol Cal: 27 mg/dL (ref 5–40)

## 2018-02-15 LAB — CBC
Hematocrit: 39 % (ref 34.0–46.6)
Hemoglobin: 13 g/dL (ref 11.1–15.9)
MCH: 28.9 pg (ref 26.6–33.0)
MCHC: 33.3 g/dL (ref 31.5–35.7)
MCV: 87 fL (ref 79–97)
Platelets: 268 10*3/uL (ref 150–450)
RBC: 4.5 x10E6/uL (ref 3.77–5.28)
RDW: 12.2 % — ABNORMAL LOW (ref 12.3–15.4)
WBC: 6.2 10*3/uL (ref 3.4–10.8)

## 2018-02-15 LAB — TSH: TSH: 1.45 u[IU]/mL (ref 0.450–4.500)

## 2018-02-15 LAB — HIV ANTIBODY (ROUTINE TESTING W REFLEX): HIV Screen 4th Generation wRfx: NONREACTIVE

## 2018-03-22 ENCOUNTER — Encounter: Payer: Self-pay | Admitting: Family Medicine

## 2018-04-11 ENCOUNTER — Ambulatory Visit
Admission: RE | Admit: 2018-04-11 | Discharge: 2018-04-11 | Disposition: A | Discharge status: Home / Self Care | Payer: BLUE CROSS/BLUE SHIELD | Source: Ambulatory Visit | Attending: Family Medicine | Admitting: Family Medicine

## 2018-04-11 DIAGNOSIS — Z1231 Encounter for screening mammogram for malignant neoplasm of breast: Secondary | ICD-10-CM

## 2018-04-13 ENCOUNTER — Other Ambulatory Visit: Payer: Self-pay | Admitting: Family Medicine

## 2018-04-13 DIAGNOSIS — R928 Other abnormal and inconclusive findings on diagnostic imaging of breast: Secondary | ICD-10-CM

## 2018-05-02 ENCOUNTER — Ambulatory Visit: Payer: BLUE CROSS/BLUE SHIELD

## 2018-05-02 ENCOUNTER — Other Ambulatory Visit: Payer: Self-pay | Admitting: Family Medicine

## 2018-05-02 ENCOUNTER — Ambulatory Visit
Admission: RE | Admit: 2018-05-02 | Discharge: 2018-05-02 | Disposition: A | Discharge status: Home / Self Care | Payer: BLUE CROSS/BLUE SHIELD | Source: Ambulatory Visit | Attending: Family Medicine | Admitting: Family Medicine

## 2018-05-02 DIAGNOSIS — R921 Mammographic calcification found on diagnostic imaging of breast: Secondary | ICD-10-CM | POA: Diagnosis not present

## 2018-05-02 DIAGNOSIS — R928 Other abnormal and inconclusive findings on diagnostic imaging of breast: Secondary | ICD-10-CM

## 2018-05-08 ENCOUNTER — Ambulatory Visit
Admission: RE | Admit: 2018-05-08 | Discharge: 2018-05-08 | Disposition: A | Discharge status: Home / Self Care | Payer: BLUE CROSS/BLUE SHIELD | Source: Ambulatory Visit | Attending: Family Medicine | Admitting: Family Medicine

## 2018-05-08 DIAGNOSIS — R921 Mammographic calcification found on diagnostic imaging of breast: Secondary | ICD-10-CM

## 2018-05-08 DIAGNOSIS — R928 Other abnormal and inconclusive findings on diagnostic imaging of breast: Secondary | ICD-10-CM

## 2018-05-08 DIAGNOSIS — N6489 Other specified disorders of breast: Secondary | ICD-10-CM | POA: Diagnosis not present

## 2018-05-08 DIAGNOSIS — N6012 Diffuse cystic mastopathy of left breast: Secondary | ICD-10-CM | POA: Diagnosis not present

## 2018-05-11 ENCOUNTER — Telehealth: Payer: Self-pay | Admitting: Family Medicine

## 2018-05-11 NOTE — Telephone Encounter (Signed)
Used the translator services to call pt. We had to leave a VM asking pt to call back to the office to schedule a F/U appt with Dr. Leretha Pol. When pt calls back, please schedule with Dr. Leretha Pol at her earliest appt. If you need to use a same day, please call [place] and ask for [place] or Kenney Houseman.   Thank you!

## 2018-05-16 ENCOUNTER — Other Ambulatory Visit: Payer: Self-pay | Admitting: Surgery

## 2018-05-16 ENCOUNTER — Ambulatory Visit: Payer: Self-pay | Admitting: Surgery

## 2018-05-16 DIAGNOSIS — R920 Mammographic microcalcification found on diagnostic imaging of breast: Secondary | ICD-10-CM

## 2018-05-16 NOTE — H&P (View-Only) (Signed)
Linda Henry Documented: 05/16/2018 2:19 PM Location: [facility] Patient #: [id number] DOB: 04/14/1966 Married / Language: Undefined / Race: Undefined Female  History of Present Illness (Nimesh Riolo A. Kalyssa Anker MD; 05/16/2018 3:05 PM) Patient words: The patient was sent to request of Dr. Lynn for abnormal mammogram. The patient went screening mammography recently which showed areas of microcalcifications in both breasts. He is felt to be atypical. There is 2 on the left on the right or the upper outer quadrants of each. 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 All were felt to be atypical excision recommended. The patient speaks Spanish and translator used today for interpretation.                CLINICAL DATA: Patient recalled from screening for bilateral breast 

## 2018-05-16 NOTE — H&P (Unsigned)
Linda Henry Documented: 05/16/2018 2:19 PM Location: [facility] Patient #: [id number] DOB: 07/31/1965 Married / Language: Undefined / Race: Undefined Female  History of Present Illness (Krystena Reitter A. Waylyn Tenbrink MD; 05/16/2018 3:05 PM) Patient words: The patient was sent to request of Dr. Larita Fife for abnormal mammogram. The patient went screening mammography recently which showed areas of microcalcifications in both breasts. He is felt to be atypical. There is 2 on the left on the right or the upper outer quadrants of each. 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 All were felt to be atypical excision recommended. The patient speaks Spanish and translator used today for interpretation.                CLINICAL DATA: Patient recalled from screening for bilateral breast

## 2018-05-16 NOTE — H&P (Signed)
[name] Documented: 05/16/2018 2:19 PM Location: [facility] Patient #: [id number] DOB: 1965-09-23 Married / Language: Undefined / Race: Undefined Female  History of Present Illness (Cyann Venti A. Aceyn Kathol MD; 05/16/2018 3:05 PM) Patient words: The patient was sent to request of Dr. Larita Fife for abnormal mammogram. The patient went screening mammography recently which showed areas of microcalcifications in both breasts. He is felt to be atypical. There is 2 on the left on the right or the upper outer quadrants of each. 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 All were felt to be atypical excision recommended. The patient speaks Spanish and translator used today for interpretation.                CLINICAL DATA: Patient recalled from screening for bilateral breast

## 2018-06-06 ENCOUNTER — Encounter (HOSPITAL_BASED_OUTPATIENT_CLINIC_OR_DEPARTMENT_OTHER): Payer: Self-pay | Admitting: *Deleted

## 2018-06-06 ENCOUNTER — Ambulatory Visit
Admission: RE | Admit: 2018-06-06 | Discharge: 2018-06-06 | Disposition: A | Discharge status: Home / Self Care | Payer: BLUE CROSS/BLUE SHIELD | Source: Ambulatory Visit | Attending: Surgery | Admitting: Surgery

## 2018-06-06 ENCOUNTER — Encounter (HOSPITAL_BASED_OUTPATIENT_CLINIC_OR_DEPARTMENT_OTHER)
Admission: RE | Admit: 2018-06-06 | Discharge: 2018-06-06 | Disposition: A | Discharge status: Home / Self Care | Payer: BLUE CROSS/BLUE SHIELD | Source: Ambulatory Visit | Attending: Surgery | Admitting: Surgery

## 2018-06-06 ENCOUNTER — Other Ambulatory Visit: Payer: Self-pay

## 2018-06-06 DIAGNOSIS — R920 Mammographic microcalcification found on diagnostic imaging of breast: Secondary | ICD-10-CM

## 2018-06-06 DIAGNOSIS — R921 Mammographic calcification found on diagnostic imaging of breast: Secondary | ICD-10-CM | POA: Diagnosis not present

## 2018-06-06 NOTE — Progress Notes (Signed)
Ensure pre surgery drink given with instructions to complete by 0530 dos, surgical soap given with instructions, pt verbalized understanding.  EKG reviewed by Dr. Renold DonGermeroth, will proceed with surgery as scheduled.

## 2018-06-08 ENCOUNTER — Ambulatory Visit
Admission: RE | Admit: 2018-06-08 | Discharge: 2018-06-08 | Disposition: A | Discharge status: Home / Self Care | Payer: BLUE CROSS/BLUE SHIELD | Source: Ambulatory Visit | Attending: Surgery | Admitting: Surgery

## 2018-06-08 ENCOUNTER — Encounter (HOSPITAL_BASED_OUTPATIENT_CLINIC_OR_DEPARTMENT_OTHER)
Admission: RE | Disposition: A | Discharge status: Home / Self Care | Payer: Self-pay | Source: Ambulatory Visit | Attending: Surgery

## 2018-06-08 ENCOUNTER — Other Ambulatory Visit: Payer: Self-pay

## 2018-06-08 ENCOUNTER — Encounter (HOSPITAL_BASED_OUTPATIENT_CLINIC_OR_DEPARTMENT_OTHER): Payer: Self-pay

## 2018-06-08 ENCOUNTER — Ambulatory Visit (HOSPITAL_BASED_OUTPATIENT_CLINIC_OR_DEPARTMENT_OTHER)
Admission: RE | Admit: 2018-06-08 | Discharge: 2018-06-08 | Disposition: A | Discharge status: Home / Self Care | Payer: BLUE CROSS/BLUE SHIELD | Source: Ambulatory Visit | Attending: Surgery | Admitting: Surgery

## 2018-06-08 ENCOUNTER — Ambulatory Visit (HOSPITAL_BASED_OUTPATIENT_CLINIC_OR_DEPARTMENT_OTHER): Payer: BLUE CROSS/BLUE SHIELD | Admitting: Anesthesiology

## 2018-06-08 DIAGNOSIS — N6489 Other specified disorders of breast: Secondary | ICD-10-CM | POA: Diagnosis not present

## 2018-06-08 DIAGNOSIS — Z79899 Other long term (current) drug therapy: Secondary | ICD-10-CM | POA: Insufficient documentation

## 2018-06-08 DIAGNOSIS — R920 Mammographic microcalcification found on diagnostic imaging of breast: Secondary | ICD-10-CM

## 2018-06-08 DIAGNOSIS — G8918 Other acute postprocedural pain: Secondary | ICD-10-CM | POA: Diagnosis not present

## 2018-06-08 DIAGNOSIS — N6012 Diffuse cystic mastopathy of left breast: Secondary | ICD-10-CM | POA: Diagnosis not present

## 2018-06-08 DIAGNOSIS — I10 Essential (primary) hypertension: Secondary | ICD-10-CM | POA: Insufficient documentation

## 2018-06-08 DIAGNOSIS — N6081 Other benign mammary dysplasias of right breast: Secondary | ICD-10-CM | POA: Diagnosis not present

## 2018-06-08 DIAGNOSIS — R928 Other abnormal and inconclusive findings on diagnostic imaging of breast: Secondary | ICD-10-CM | POA: Diagnosis not present

## 2018-06-08 DIAGNOSIS — N6011 Diffuse cystic mastopathy of right breast: Secondary | ICD-10-CM | POA: Diagnosis not present

## 2018-06-08 DIAGNOSIS — R921 Mammographic calcification found on diagnostic imaging of breast: Secondary | ICD-10-CM | POA: Diagnosis not present

## 2018-06-08 HISTORY — DX: Unspecified lump in unspecified breast: N63.0

## 2018-06-08 HISTORY — PX: BREAST LUMPECTOMY WITH RADIOACTIVE SEED LOCALIZATION: SHX6424

## 2018-06-08 SURGERY — BREAST LUMPECTOMY WITH RADIOACTIVE SEED LOCALIZATION
Anesthesia: General | Site: Breast | Laterality: Bilateral

## 2018-06-08 MED ORDER — ACETAMINOPHEN 500 MG PO TABS
ORAL_TABLET | ORAL | Status: AC
  Filled 2018-06-08: qty 2

## 2018-06-08 MED ORDER — CEFAZOLIN SODIUM-DEXTROSE 2-4 GM/100ML-% IV SOLN
INTRAVENOUS | Status: AC
  Filled 2018-06-08: qty 100

## 2018-06-08 MED ORDER — DEXAMETHASONE SODIUM PHOSPHATE 10 MG/ML IJ SOLN
INTRAMUSCULAR | Status: AC
  Filled 2018-06-08: qty 1

## 2018-06-08 MED ORDER — EPHEDRINE SULFATE 50 MG/ML IJ SOLN
INTRAMUSCULAR | Status: DC | PRN
  Administered 2018-06-08: 10 mg via INTRAVENOUS

## 2018-06-08 MED ORDER — ONDANSETRON HCL 4 MG/2ML IJ SOLN
INTRAMUSCULAR | Status: DC | PRN
  Administered 2018-06-08: 4 mg via INTRAVENOUS

## 2018-06-08 MED ORDER — PROMETHAZINE HCL 25 MG/ML IJ SOLN
6.2500 mg | INTRAMUSCULAR | Status: DC | PRN
  Administered 2018-06-08: 6.25 mg via INTRAVENOUS

## 2018-06-08 MED ORDER — ACETAMINOPHEN 500 MG PO TABS
1000.0000 mg | ORAL_TABLET | ORAL | Status: AC
  Administered 2018-06-08: 1000 mg via ORAL

## 2018-06-08 MED ORDER — MIDAZOLAM HCL 2 MG/2ML IJ SOLN
1.0000 mg | INTRAMUSCULAR | Status: DC | PRN
  Administered 2018-06-08: 2 mg via INTRAVENOUS

## 2018-06-08 MED ORDER — OXYCODONE HCL 5 MG PO TABS: 5.0000 mg | ORAL_TABLET | Freq: Once | ORAL | Status: DC | PRN

## 2018-06-08 MED ORDER — FENTANYL CITRATE (PF) 100 MCG/2ML IJ SOLN
INTRAMUSCULAR | Status: AC
  Filled 2018-06-08: qty 2

## 2018-06-08 MED ORDER — ONDANSETRON HCL 4 MG/2ML IJ SOLN
INTRAMUSCULAR | Status: AC
  Filled 2018-06-08: qty 2

## 2018-06-08 MED ORDER — HYDROCODONE-ACETAMINOPHEN 5-325 MG PO TABS: 1.0000 | ORAL_TABLET | Freq: Four times a day (QID) | ORAL | 0 refills | Status: DC | PRN

## 2018-06-08 MED ORDER — LIDOCAINE 2% (20 MG/ML) 5 ML SYRINGE
INTRAMUSCULAR | Status: DC | PRN
  Administered 2018-06-08: 50 mg via INTRAVENOUS

## 2018-06-08 MED ORDER — OXYCODONE HCL 5 MG/5ML PO SOLN: 5.0000 mg | Freq: Once | ORAL | Status: DC | PRN

## 2018-06-08 MED ORDER — SODIUM CHLORIDE (PF) 0.9 % IJ SOLN
INTRAMUSCULAR | Status: AC
  Filled 2018-06-08: qty 10

## 2018-06-08 MED ORDER — FENTANYL CITRATE (PF) 100 MCG/2ML IJ SOLN
50.0000 ug | INTRAMUSCULAR | Status: DC | PRN
  Administered 2018-06-08: 100 ug via INTRAVENOUS

## 2018-06-08 MED ORDER — ROPIVACAINE HCL 5 MG/ML IJ SOLN
INTRAMUSCULAR | Status: DC | PRN
  Administered 2018-06-08: 40 mL via PERINEURAL

## 2018-06-08 MED ORDER — HYDROMORPHONE HCL 1 MG/ML IJ SOLN: 0.2500 mg | INTRAMUSCULAR | Status: DC | PRN

## 2018-06-08 MED ORDER — CEFAZOLIN SODIUM-DEXTROSE 2-4 GM/100ML-% IV SOLN
2.0000 g | Freq: Once | INTRAVENOUS | Status: AC
  Administered 2018-06-08: 2 g via INTRAVENOUS

## 2018-06-08 MED ORDER — LACTATED RINGERS IV SOLN
INTRAVENOUS | Status: DC
  Administered 2018-06-08: 10 mL/h via INTRAVENOUS

## 2018-06-08 MED ORDER — PROMETHAZINE HCL 25 MG/ML IJ SOLN
INTRAMUSCULAR | Status: AC
  Filled 2018-06-08: qty 1

## 2018-06-08 MED ORDER — GABAPENTIN 300 MG PO CAPS
300.0000 mg | ORAL_CAPSULE | ORAL | Status: AC
  Administered 2018-06-08: 300 mg via ORAL

## 2018-06-08 MED ORDER — BUPIVACAINE-EPINEPHRINE (PF) 0.25% -1:200000 IJ SOLN
INTRAMUSCULAR | Status: DC | PRN
  Administered 2018-06-08: 8 mL

## 2018-06-08 MED ORDER — IBUPROFEN 800 MG PO TABS: 800.0000 mg | ORAL_TABLET | Freq: Three times a day (TID) | ORAL | 0 refills | Status: DC | PRN

## 2018-06-08 MED ORDER — MEPERIDINE HCL 25 MG/ML IJ SOLN: 6.2500 mg | INTRAMUSCULAR | Status: DC | PRN

## 2018-06-08 MED ORDER — SCOPOLAMINE 1 MG/3DAYS TD PT72: 1.0000 | MEDICATED_PATCH | Freq: Once | TRANSDERMAL | Status: DC | PRN

## 2018-06-08 MED ORDER — GABAPENTIN 300 MG PO CAPS
ORAL_CAPSULE | ORAL | Status: AC
  Filled 2018-06-08: qty 1

## 2018-06-08 MED ORDER — CHLORHEXIDINE GLUCONATE CLOTH 2 % EX PADS: 6.0000 | MEDICATED_PAD | Freq: Once | CUTANEOUS | Status: DC

## 2018-06-08 MED ORDER — EPHEDRINE 5 MG/ML INJ
INTRAVENOUS | Status: AC
  Filled 2018-06-08: qty 10

## 2018-06-08 MED ORDER — CELECOXIB 200 MG PO CAPS
ORAL_CAPSULE | ORAL | Status: AC
  Filled 2018-06-08: qty 2

## 2018-06-08 MED ORDER — LIDOCAINE 2% (20 MG/ML) 5 ML SYRINGE
INTRAMUSCULAR | Status: AC
  Filled 2018-06-08: qty 5

## 2018-06-08 MED ORDER — CELECOXIB 400 MG PO CAPS
400.0000 mg | ORAL_CAPSULE | ORAL | Status: AC
  Administered 2018-06-08: 400 mg via ORAL

## 2018-06-08 MED ORDER — DEXAMETHASONE SODIUM PHOSPHATE 4 MG/ML IJ SOLN
INTRAMUSCULAR | Status: DC | PRN
  Administered 2018-06-08: 10 mg via INTRAVENOUS

## 2018-06-08 MED ORDER — DEXTROSE 5 % IV SOLN: 3.0000 g | INTRAVENOUS | Status: DC

## 2018-06-08 MED ORDER — MIDAZOLAM HCL 2 MG/2ML IJ SOLN
INTRAMUSCULAR | Status: AC
  Filled 2018-06-08: qty 2

## 2018-06-08 MED ORDER — PROPOFOL 10 MG/ML IV BOLUS
INTRAVENOUS | Status: DC | PRN
  Administered 2018-06-08: 200 mg via INTRAVENOUS

## 2018-06-08 SURGICAL SUPPLY — 54 items
APPLIER CLIP 9.375 MED OPEN (MISCELLANEOUS)
BINDER BREAST LRG (GAUZE/BANDAGES/DRESSINGS) ×2 IMPLANT
BINDER BREAST MEDIUM (GAUZE/BANDAGES/DRESSINGS) IMPLANT
BINDER BREAST XLRG (GAUZE/BANDAGES/DRESSINGS) IMPLANT
BINDER BREAST XXLRG (GAUZE/BANDAGES/DRESSINGS) IMPLANT
BLADE SURG 15 STRL LF DISP TIS (BLADE) ×1 IMPLANT
BLADE SURG 15 STRL SS (BLADE) ×1
CANISTER SUC SOCK COL 7IN (MISCELLANEOUS) IMPLANT
CANISTER SUCT 1200ML W/VALVE (MISCELLANEOUS) ×2 IMPLANT
CHLORAPREP W/TINT 26ML (MISCELLANEOUS) ×2 IMPLANT
CLIP APPLIE 9.375 MED OPEN (MISCELLANEOUS) IMPLANT
COVER BACK TABLE 60X90IN (DRAPES) ×2 IMPLANT
COVER MAYO STAND STRL (DRAPES) ×2 IMPLANT
COVER PROBE W GEL 5X96 (DRAPES) ×2 IMPLANT
COVER WAND RF STERILE (DRAPES) IMPLANT
DECANTER SPIKE VIAL GLASS SM (MISCELLANEOUS) IMPLANT
DERMABOND ADVANCED (GAUZE/BANDAGES/DRESSINGS) ×2
DERMABOND ADVANCED .7 DNX12 (GAUZE/BANDAGES/DRESSINGS) ×2 IMPLANT
DEVICE DUBIN W/COMP PLATE 8390 (MISCELLANEOUS) ×4 IMPLANT
DRAPE LAPAROSCOPIC ABDOMINAL (DRAPES) ×2 IMPLANT
DRAPE LAPAROTOMY 100X72 PEDS (DRAPES) IMPLANT
DRAPE UTILITY XL STRL (DRAPES) ×2 IMPLANT
ELECT COATED BLADE 2.86 ST (ELECTRODE) ×2 IMPLANT
ELECT REM PT RETURN 9FT ADLT (ELECTROSURGICAL) ×2
ELECTRODE REM PT RTRN 9FT ADLT (ELECTROSURGICAL) ×1 IMPLANT
GLOVE BIO SURGEON STRL SZ 6.5 (GLOVE) ×2 IMPLANT
GLOVE BIO SURGEON STRL SZ7 (GLOVE) ×2 IMPLANT
GLOVE BIOGEL PI IND STRL 7.0 (GLOVE) ×1 IMPLANT
GLOVE BIOGEL PI IND STRL 7.5 (GLOVE) ×1 IMPLANT
GLOVE BIOGEL PI IND STRL 8 (GLOVE) ×1 IMPLANT
GLOVE BIOGEL PI INDICATOR 7.0 (GLOVE) ×1
GLOVE BIOGEL PI INDICATOR 7.5 (GLOVE) ×1
GLOVE BIOGEL PI INDICATOR 8 (GLOVE) ×1
GLOVE ECLIPSE 8.0 STRL XLNG CF (GLOVE) ×4 IMPLANT
GLOVE EXAM NITRILE MD LF STRL (GLOVE) ×2 IMPLANT
GOWN STRL REUS W/ TWL LRG LVL3 (GOWN DISPOSABLE) ×2 IMPLANT
GOWN STRL REUS W/TWL LRG LVL3 (GOWN DISPOSABLE) ×2
HEMOSTAT ARISTA ABSORB 3G PWDR (MISCELLANEOUS) IMPLANT
HEMOSTAT SNOW SURGICEL 2X4 (HEMOSTASIS) IMPLANT
KIT MARKER MARGIN INK (KITS) ×2 IMPLANT
NEEDLE HYPO 25X1 1.5 SAFETY (NEEDLE) ×2 IMPLANT
NS IRRIG 1000ML POUR BTL (IV SOLUTION) IMPLANT
PACK BASIN DAY SURGERY FS (CUSTOM PROCEDURE TRAY) ×2 IMPLANT
PENCIL BUTTON HOLSTER BLD 10FT (ELECTRODE) ×2 IMPLANT
SLEEVE SCD COMPRESS KNEE MED (MISCELLANEOUS) ×2 IMPLANT
SPONGE LAP 4X18 RFD (DISPOSABLE) ×4 IMPLANT
SUT MNCRL AB 4-0 PS2 18 (SUTURE) ×6 IMPLANT
SUT SILK 2 0 SH (SUTURE) IMPLANT
SUT VICRYL 3-0 CR8 SH (SUTURE) ×2 IMPLANT
SYR CONTROL 10ML LL (SYRINGE) ×2 IMPLANT
TOWEL GREEN STERILE FF (TOWEL DISPOSABLE) ×2 IMPLANT
TOWEL OR NON WOVEN STRL DISP B (DISPOSABLE) IMPLANT
TUBE CONNECTING 20X1/4 (TUBING) ×2 IMPLANT
YANKAUER SUCT BULB TIP NO VENT (SUCTIONS) ×2 IMPLANT

## 2018-06-08 NOTE — Transfer of Care (Signed)
Immediate Anesthesia Transfer of Care Note  Patient: Galvin ProfferMaria Vallecillo  Procedure(s) Performed: LEFT BREAST LUMPECTOMY WITH RADIOACTIVE SEED LOCALIZATION X2 AND RIGHT BREAST LUMPECTOMY WITH RADIOACTIVE SEED LOCALIZATION (Bilateral Breast)  Patient Location: PACU  Anesthesia Type:General and Regional  Level of Consciousness: awake and sedated  Airway & Oxygen Therapy: Patient Spontanous Breathing and Patient connected to face mask oxygen  Post-op Assessment: Report given to RN and Post -op Vital signs reviewed and stable  Post vital signs: Reviewed and stable  Last Vitals:  Vitals Value Taken Time  BP 126/74 06/08/2018 10:58 AM  Temp    Pulse 78 06/08/2018 10:59 AM  Resp 13 06/08/2018 10:59 AM  SpO2 100 % 06/08/2018 10:59 AM  Vitals shown include unvalidated device data.  Last Pain:  Vitals:   06/08/18 0808  TempSrc: Oral         Complications: No apparent anesthesia complications

## 2018-06-08 NOTE — Discharge Instructions (Signed)
Biopsia de mama (Breast Biopsy)  Una biopsia de mama es un procedimiento en el cual se extrae Lauris Poag de tejido sospechoso de la mama. Luego del procedimiento, el tejido o lquido extrado de la mama se examina con un microscopio para determinar si hay clulas cancerosas presentes. Puede necesitar realizarse una biopsia de mama si tiene los siguientes signos o sntomas:  Un bulto en la mama no diagnosticado (tumor).  Anormalidades, hundimientos, costras o ulceraciones en el pezn.  Secrecin anormal del pezn, Garment/textile technologist.  Enrojecimiento, hinchazn y Engineer, mining de las [address]  Depsitos de calcio (calcificaciones) o anomalas observadas en una mamografa, ecografa o resonancia magntica (RM).  Cambios sospechosos en la mama que se observan en la mamografa. Si se descubre que la anomala es cancerosa (maligna), una biopsia de mama puede ayudar a Chief Strategy Officer cul es el mejor tratamiento para usted. Hay diferentes tipos de biopsia de mama. Hable con el mdico acerca de las opciones y cul es la mejor para usted. INFORME A SU MDICO:  Cualquier alergia que tenga.  Todos los [employer], incluidos vitaminas, hierbas, gotas oftlmicas, cremas y [address] de [address]  Problemas previos que usted o los [employer] de su familia hayan tenido con el uso de anestsicos.  Enfermedades de la sangre que tenga.  Si tiene cirugas previas.  Cualquier enfermedad que tenga.  Si est embarazada o podra estarlo. RIESGOS Y COMPLICACIONES En general, se trata de un procedimiento seguro. Sin embargo, pueden ocurrir complicaciones, por ejemplo:  Hemorragia.  Infeccin.  Molestias. Esto es temporal.  Reacciones alrgicas a los medicamentos.  Hematomas e hinchazn de la mama.  Alteracin en la forma de la mama.  Dao a otros tejidos.  No se encuentra el bulto o la anomala.  Necesidad de [place]. ANTES DEL PROCEDIMIENTO  Haga planes para que una  persona lo lleve de vuelta a su casa despus del procedimiento.  No consuma ningn producto que contenga tabaco, lo que incluye cigarrillos, tabaco de Theatre manager y Administrator, Civil Service. Si necesita ayuda para dejar de fumar, consulte al mdico.  No beba alcohol durante las 24 horas previas al procedimiento.  Consulte al mdico si debe hacer o no lo siguiente: ? Cambiar o suspender los medicamentos que toma habitualmente. Esto es muy importante si toma medicamentos para la diabetes o anticoagulantes. ? Tomar medicamentos como aspirina e ibuprofeno. Estos medicamentos pueden tener un efecto anticoagulante en la [place]. No tome estos medicamentos antes del procedimiento si el mdico le indica que no lo haga.  Use un buen sostn para el procedimiento.  Pregntele al mdico cmo se Cabin crew o se Audiological scientist de la Leisure centre manager.  Pueden darle antibiticos para ayudar a prevenir las infecciones.  El mdico puede realizar un procedimiento para Scientific laboratory technician un alambre (marcacin con Set designer) o de una semilla que emite radiacin (marcacin con semilla radiactiva) en el ndulo mamario. Durante el procedimiento, se realizar [place] Solo, ecografa o RM, o bien, una combinacin de estas tcnicas para identificar la ubicacin de la anomala en la mama. La tcnica de obtencin de imgenes depender del tipo de biopsia que se realice. El alambre o la semilla ayudarn al mdico a Clinical research associate el ndulo cuando realiza la biopsia, especialmente si este no se palpable. PROCEDIMIENTO Pueden administrarle uno de los siguientes medicamentos o ambos:  Un medicamento para adormecer la zona de la mama (anestesia local).  Un medicamento que lo ayudar a relajarse (sedante) durante el procedimiento. Los siguientes son los diferentes tipos de biopsia que se  pueden realizar. Aspiracin con aguja fina Se colocar una aguja fina en [place] y se insertar en un quiste de la mama. Se extraern clulas y lquido. Esta tcnica  no es tan frecuente como una biopsia con [place]. Biopsia con aguja gruesa Se insertar una aguja de seccin amplia (aguja gruesa) en el bulto de la mama varias veces para extraer muestras de tejido. Biopsia estereotctica Deber recostarse boca abajo en una camilla. Se pasar la mama a travs de una abertura en la camilla y se comprimir suavemente hasta ajustar la posicin. Se utilizar un equipo de rayos X y [place] computadora para [place] el bulto de la mama. El cirujano Chemical engineer esta informacin para obtener varias muestras de tejido mediante una aguja de Production manager. Biopsia asistida por vaco Se le realizar una pequea incisin (menos de  de pulgada [58mm]) en la mama. Un equipo para biopsia que incluye una [place] y un suctor se pasarn a travs de la incisin dentro del tejido [place]. El suctor suavemente drenar tejido mamario anormal hacia la aguja para extraerlo. No se necesitarn puntos (suturas). La incisin puede cubrirse con una venda (vendaje). En este tipo de biopsia, se extrae una muestra ms grande de tejido que la que se extrae habitualmente con la biopsia con [place] Ponder gruesa. Biopsia con [place] Ponder gruesa guiada con ecografa Se utilizar una ecografa de alta frecuencia para ayudar a guiar la aguja gruesa a la zona de la masa o anomala. Se har una incisin para insertar la aguja. Luego se extraern muestras de tejido. Biopsia quirrgica Este mtodo requiere de una incisin en la mama para extraer parte del tejido sospechoso o su totalidad. Despus de que se extraiga el tejido, la piel de la zona se cerrar con suturas y se cubrir con un vendaje. Existen dos tipos de biopsias quirrgicas:  Biopsia incisional. El cirujano extraer parte del bulto mamario.  Biopsia escisional El Air cabin crew extirpar todo el bulto de la mama o todo lo que pueda. Despus de cualquiera de estos procedimientos, el tejido o lquido que se extraiga se examinar con un microscopio. DESPUS DEL  PROCEDIMIENTO  Lo conducirn a la zona de recuperacin. Cuando se encuentre bien y no tenga problemas, podr volver a Pensions consultant.  Podr notar hematomas en la mama. Esto es normal.  Es posible que se le aplique un vendaje compresivo sobre la mama durante 24 a 48 horas. El vendaje compresivo se ajusta de manera apretada alrededor del trax para evitar que se acumule lquido debajo de los tejidos. Quiz tambin se Writer un sostn de soporte durante [place].  No conduzca durante 24horas si le administraron un sedante. Esta informacin no tiene Theme park manager el consejo del mdico. Asegrese de hacerle al mdico cualquier pregunta que tenga. Document Released: 04/21/2005 Document Revised: 11/03/2015 Document Reviewed: 04/15/2015 Elsevier Interactive Patient Education  2018 [employer].   Biopsia de mama, cuidados posteriores (Breast Biopsy, Care After) Siga estas instrucciones durante las prximas semanas. Estas indicaciones le proporcionan informacin acerca de cmo deber cuidarse despus del procedimiento. El mdico tambin podr darle instrucciones ms especficas. El tratamiento ha sido planificado segn las prcticas mdicas actuales, pero en algunos casos pueden ocurrir problemas. Comunquese con el mdico si tiene algn problema o dudas despus del procedimiento. QU ESPERAR DESPUS DEL PROCEDIMIENTO Despus del procedimiento, es comn [employer] siguientes sntomas:  Hematomas en la mama.  Adormecimiento, hormigueo o dolor cerca del lugar de la biopsia. INSTRUCCIONES PARA EL CUIDADO EN EL HOGAR Medicamentos  Tome los medicamentos de venta libre y los recetados solamente como se lo haya indicado el mdico.  No conduzca durante 24horas si le administraron un sedante.  No beba alcohol si toma analgsicos.  No conduzca ni opere maquinaria pesada mientras toma analgsicos recetados. Cuidado del Environmental consultantlugar de la biopsia  Siga las indicaciones del mdico acerca del  cuidado de la incisin o del lugar de la puncin. Haga lo siguiente: ? Lvese las manos con agua y [place] antes de cambiar el vendaje. Use desinfectante para manos si no dispone de [place] y [place]. ? Cambie las vendas (vendajes) como se lo haya indicado el mdico. ? No retire los puntos (suturas), el QUALCOMMadhesivo para la piel o las tiras [place]adhesivas. Es posible que estos deban quedar puestos en la piel durante 2semanas o ms tiempo. Si los bordes de las tiras [address] empiezan a despegarse y Scientific laboratory technicianenroscarse, puede recortar los que estn sueltos. No retire las tiras Agilent Technologiesadhesivas por completo a menos que el mdico se lo indique.  Si tiene suturas, mantngalas secas al baarse.  Controle la incisin o Immunologistel lugar de la puncin todos los [address] para detectar signos de infeccin. Est atenta a los siguientes signos: ? Aumento del enrojecimiento, la hinchazn o Chief Technology Officerel dolor. ? Ms lquido [place] sangre. ? Calor. ? Pus o mal olor.  Proteja la zona de la biopsia. No deje que la zona se inflame. Actividad  Si le hicieron una incisin durante el procedimiento, evite las actividades que podran tironear y abrir Immunologistel lugar de la incisin. Evite elongar, estirarse, hacer ejercicios, practicar deportes o levantar objetos que pesen ms de 3libras (1,4kg).  Reanude sus actividades normales como se lo haya indicado el mdico. Pregntele al mdico qu actividades son seguras para usted. Instrucciones generales  Siga con su dieta habitual.  Use un buen sostn de soporte durante todo el tiempo que se lo haya indicado el mdico.  Realcese controles para Engineer, manufacturingdetectar exceso de lquido alrededor de los ganglios linfticos (linfedema) con la frecuencia que le haya indicado el mdico.  Concurra a todas las visitas de control como se lo haya indicado el mdico. Esto es importante. SOLICITE ATENCIN MDICA SI:  Tiene ms enrojecimiento, hinchazn o dolor en el lugar de la biopsia.  Observa ms lquido o sangre que salen del sitio de la  biopsia.  El sitio de la biopsia est caliente al tacto.  Tiene pus o percibe mal olor que proviene del lugar de la biopsia.  El lugar de la biopsia se abre despus de que le han retirado las suturas, las grapas o las tiras [place]adhesivas.  Tiene una erupcin cutnea.  Tiene fiebre. SOLICITE ATENCIN MDICA DE INMEDIATO SI:  Aumenta el sangrado (ms de una pequea mancha) en el lugar de la biopsia.  Tiene dificultad para respirar.  Tiene rayas rojas alrededor del lugar de la biopsia. Esta informacin no tiene Theme park managercomo fin reemplazar el consejo del mdico. Asegrese de hacerle al mdico cualquier pregunta que tenga. Document Released: 04/05/2012 Document Revised: 11/03/2015 Document Reviewed: 04/15/2015 Elsevier Interactive Patient Education  2018 [employer]       [facility] Office Phone Number [phone]  BREAST BIOPSY/ PARTIAL MASTECTOMY: POST OP INSTRUCTIONS  Always review your discharge instruction sheet given to you by the facility where your surgery was performed.  IF YOU HAVE DISABILITY OR FAMILY LEAVE FORMS, YOU MUST BRING THEM TO THE OFFICE FOR PROCESSING.  DO NOT GIVE THEM TO YOUR DOCTOR.  1. A prescription for pain medication may be given to  you upon discharge.  Take your pain medication as prescribed, if needed.  If narcotic pain medicine is not needed, then you may take acetaminophen (Tylenol) or ibuprofen (Advil) as needed. No Tylenol until 2:45pm, no ibuprofen until 4:45pm 2. Take your usually prescribed medications unless otherwise directed 3. If you need a refill on your pain medication, please contact your pharmacy.  They will contact our office to request authorization.  Prescriptions will not be filled after 5pm or on week-ends. 4. You should eat very light the first 24 hours after surgery, such as soup, crackers, pudding, etc.  Resume your normal diet the day after surgery. 5. Most patients will experience some swelling and bruising in the  breast.  Ice packs and a good support bra will help.  Swelling and bruising can take several days to resolve.  6. It is common to experience some constipation if taking pain medication after surgery.  Increasing fluid intake and taking a stool softener will usually help or prevent this problem from occurring.  A mild laxative (Milk of Magnesia or Miralax) should be taken according to package directions if there are no bowel movements after 48 hours. 7. Unless discharge instructions indicate otherwise, you may remove your bandages 24-48 hours after surgery, and you may shower at that time.  You may have steri-strips (small skin tapes) in place directly over the incision.  These strips should be left on the skin for 7-10 days.  If your surgeon used skin glue on the incision, you may shower in 24 hours.  The glue will flake off over the next 2-3 weeks.  Any sutures or staples will be removed at the office during your follow-up visit. 8. ACTIVITIES:  You may resume regular daily activities (gradually increasing) beginning the next day.  Wearing a good support bra or sports bra minimizes pain and swelling.  You may have sexual intercourse when it is comfortable. a. You may drive when you no longer are taking prescription pain medication, you can comfortably wear a seatbelt, and you can safely maneuver your car and apply brakes. b. RETURN TO WORK:  ___________2 WEEKS ___________________________________________________________________________ 9. You should see your doctor in the office for a follow-up appointment approximately two weeks after your surgery.  Your doctors nurse will typically make your follow-up appointment when she calls you with your pathology report.  Expect your pathology report 2-3 business days after your surgery.  You may call to check if you do not hear from [place] after three days. 10. OTHER INSTRUCTIONS:  _______________________________________________________________________________________________ _____________________________________________________________________________________________________________________________________ _____________________________________________________________________________________________________________________________________ _____________________________________________________________________________________________________________________________________  WHEN TO CALL YOUR DOCTOR: 1. Fever over 101.0 2. Nausea and/or vomiting. 3. Extreme swelling or bruising. 4. Continued bleeding from incision. 5. Increased pain, redness, or drainage from the incision.  The clinic staff is available to answer your questions during regular business hours.  Please dont hesitate to call and ask to speak to one of the nurses for clinical concerns.  If you have a medical emergency, go to the nearest emergency room or call 911.  A surgeon from [facility] is always on call at the hospital.  For further questions, please visit [email or web address]     Post Anesthesia Home Care Instructions  Activity: Get plenty of rest for the remainder of the day. A responsible individual must stay with you for 24 hours following the procedure.  For the next 24 hours, DO NOT: -Drive a car -Advertising copywriter -Drink alcoholic beverages -Take any medication unless instructed by your physician -Make any legal  decisions or sign important papers.  Meals: Start with liquid foods such as gelatin or soup. Progress to regular foods as tolerated. Avoid greasy, spicy, heavy foods. If nausea and/or vomiting occur, drink only clear liquids until the nausea and/or vomiting subsides. Call your physician if vomiting continues.  Special Instructions/Symptoms: Your throat may feel dry or sore from the anesthesia or the breathing tube placed in your throat during surgery. If this  causes discomfort, gargle with warm salt water. The discomfort should disappear within 24 hours.  If you had a scopolamine patch placed behind your ear for the management of post- operative nausea and/or vomiting:  1. The medication in the patch is effective for 72 hours, after which it should be removed.  Wrap patch in a tissue and discard in the trash. Wash hands thoroughly with soap and water. 2. You may remove the patch earlier than 72 hours if you experience unpleasant side effects which may include dry mouth, dizziness or visual disturbances. 3. Avoid touching the patch. Wash your hands with soap and water after contact with the patch.

## 2018-06-08 NOTE — Anesthesia Procedure Notes (Signed)
Procedure Name: LMA Insertion Performed by: Eleyna Brugh W, CRNA Pre-anesthesia Checklist: Patient identified, Emergency Drugs available, Suction available and Patient being monitored Patient Re-evaluated:Patient Re-evaluated prior to induction Oxygen Delivery Method: Circle system utilized Preoxygenation: Pre-oxygenation with 100% oxygen Induction Type: IV induction Ventilation: Mask ventilation without difficulty LMA: LMA inserted LMA Size: 4.0 Number of attempts: 1 Placement Confirmation: positive ETCO2 Tube secured with: Tape Dental Injury: Teeth and Oropharynx as per pre-operative assessment        

## 2018-06-08 NOTE — Interval H&P Note (Signed)
History and Physical Interval Note:  06/08/2018 8:52 AM  Linda Henry  has presented today for surgery, with the diagnosis of Microcalcifications bilateral breasts  The various methods of treatment have been discussed with the patient and family. After consideration of risks, benefits and other options for treatment, the patient has consented to  Procedure(s) with comments: LEFT BREAST LUMPECTOMY WITH RADIOACTIVE SEED LOCALIZATION X2 AND RIGHT BREAST LUMPECTOMY WITH RADIOACTIVE SEED LOCALIZATION (Bilateral) - PECTORAL BLOCK as a surgical intervention .  The patient's history has been reviewed, patient examined, no change in status, stable for surgery.  I have reviewed the patient's chart and labs.  Questions were answered to the patient's satisfaction.     [name] A Caryl Fate

## 2018-06-08 NOTE — Anesthesia Procedure Notes (Signed)
Anesthesia Regional Block: Pectoralis block   Pre-Anesthetic Checklist: ,, timeout performed, Correct Patient, Correct Site, Correct Laterality, Correct Procedure, Correct Position, site marked, Risks and benefits discussed,  Surgical consent,  Pre-op evaluation,  At surgeon's request and post-op pain management  Laterality: Left and Right  Prep: chloraprep       Needles:  Injection technique: Single-shot  Needle Type: Stimiplex     Needle Length: 9cm  Needle Gauge: 21     Additional Needles:   Procedures:,,,, ultrasound used (permanent image in chart),,,,  Narrative:  Start time: 06/08/2018 9:25 AM End time: 06/08/2018 9:30 AM Injection made incrementally with aspirations every 5 mL.  Performed by: Personally  Anesthesiologist: Lowella CurbMiller, Aimie Wagman Ray, MD

## 2018-06-08 NOTE — Progress Notes (Signed)
Assisted Dr. Miller with right, left, ultrasound guided, pectoralis block. Side rails up, monitors on throughout procedure. See vital signs in flow sheet. Tolerated Procedure well. 

## 2018-06-08 NOTE — Anesthesia Postprocedure Evaluation (Signed)
Anesthesia Post Note  Patient: Linda Henry  Procedure(s) Performed: LEFT BREAST LUMPECTOMY WITH RADIOACTIVE SEED LOCALIZATION X2 AND RIGHT BREAST LUMPECTOMY WITH RADIOACTIVE SEED LOCALIZATION (Bilateral Breast)     Patient location during evaluation: PACU Anesthesia Type: General Level of consciousness: awake and alert Pain management: pain level controlled Vital Signs Assessment: post-procedure vital signs reviewed and stable Respiratory status: spontaneous breathing, nonlabored ventilation and respiratory function stable Cardiovascular status: blood pressure returned to baseline and stable Postop Assessment: no apparent nausea or vomiting Anesthetic complications: no    Last Vitals:  Vitals:   06/08/18 1130 06/08/18 1145  BP: (!) 149/91 132/87  Pulse: 80 76  Resp: 17 16  Temp:    SpO2: 100% 100%    Last Pain:  Vitals:   06/08/18 1215  TempSrc:   PainSc: 0-No pain                 [name]

## 2018-06-08 NOTE — H&P (Signed)
Linda Henry is an 52 y.o. female.   Chief Complaint: abnormal mammograms FIE:[id number] presents for bilateral lumpectomy due to multiple areas in each breast and marked calcifications are felt to be atypical.  She had 2 on the left one on the right.  These showed some atypical changes and lumpectomy is recommended.  She is here today with a Optometrist.  She has no questions and is ready to proceed  Past Medical History:  Diagnosis Date  . Breast mass    bilateral breasts  . Hypertension     Past Surgical History:  Procedure Laterality Date  . ABDOMINAL HYSTERECTOMY  2013   partial, for benign resons, cervix and ovaries not removed, [place]  . CESAREAN SECTION     x2    Family History  Problem Relation Age of Onset  . Healthy Mother   . Healthy Father    Social History:  reports that she has never smoked. She has never used smokeless tobacco. She reports that she does not drink alcohol or use drugs.  Allergies: No Known Allergies  Medications Prior to Admission  Medication Sig Dispense Refill  . lisinopril (PRINIVIL,ZESTRIL) 10 MG tablet Take 1 tablet (10 mg total) by mouth daily. 90 tablet 3    No results found for this or any previous visit (from the past 48 hour(s)). Mm Lt Radioactive Seed Loc Mammo Guide  Result Date: 06/06/2018 CLINICAL DATA:  52 year old female for radioactive seed localization of flat epithelial atypia within both breasts. Also for radioactive seed localization of discordant biopsy of calcifications within the LEFT breast. EXAM: MAMMOGRAPHIC GUIDED RADIOACTIVE SEED LOCALIZATION OF THE RIGHT BREAST MAMMOGRAPHIC GUIDED RADIOACTIVE SEED LOCALIZATION OF THE LEFT BREAST X 2 COMPARISON:  Previous exam(s). FINDINGS: Patient presents for radioactive seed localizations prior to bilateral breast excisions. I met with the patient and we discussed the procedures of seed localization including benefits and alternatives. We discussed the high likelihood of successful  procedures. We discussed the risks of the procedures including infection, bleeding, tissue injury and further surgery. We discussed the low dose of radioactivity involved in the procedures. Informed, written consent was given. The usual time-out protocol was performed immediately prior to the procedures. RADIOACTIVE SEED LOCALIZATION RIGHT BREAST: Using mammographic guidance, sterile technique, 1% lidocaine and an I-125 radioactive seed, the COIL clip was localized using a SUPERIOR approach. The follow-up mammogram images confirm the seed in the expected location and were marked for Dr. Brantley Stage. Follow-up survey of the patient confirms presence of the radioactive seed. Order number of I-125 seed:  [id number]. Total activity:  1.601 millicuries.  Reference Date: 05/31/2018 RADIOACTIVE SEED LOCALIZATION LEFT BREAST #1: Using mammographic guidance, sterile technique, 1% lidocaine and an I-125 radioactive seed, the COIL clip was localized using a LATERAL approach. The follow-up mammogram images confirm the seed in the expected location and were marked for Dr. Brantley Stage. Follow-up survey of the patient confirms presence of the radioactive seed. Order number of I-125 seed:  [id number]. Total activity:  2.202 millicurie.  Reference Date: 05/31/2018 RADIOACTIVE SEED LOCALIZATION LEFT BREAST #2: Using mammographic guidance, sterile technique, 1% lidocaine and an I-125 radioactive seed, the X clip was localized using a LATERAL approach. The follow-up mammogram images confirm the seed in the expected location and were marked for Dr. Brantley Stage. Follow-up survey of the patient confirms presence of the radioactive seed. Order number of I-125 seed:  [id number]. Total activity:  6.283 millicuries.  Reference Date: 05/03/2018 The patient tolerated the procedures well and was released from the [facility].  She was given instructions regarding seed removal. IMPRESSION: Radioactive seed localization - 1 site within the RIGHT breast and 2  sites within the LEFT breast. No apparent complications. Electronically Signed   By: Margarette Canada M.D.   On: 06/06/2018 14:16   Mm Lt Rad Seed Ea Add Lesion Loc Mammo  Result Date: 06/06/2018 CLINICAL DATA:  52 year old female for radioactive seed localization of flat epithelial atypia within both breasts. Also for radioactive seed localization of discordant biopsy of calcifications within the LEFT breast. EXAM: MAMMOGRAPHIC GUIDED RADIOACTIVE SEED LOCALIZATION OF THE RIGHT BREAST MAMMOGRAPHIC GUIDED RADIOACTIVE SEED LOCALIZATION OF THE LEFT BREAST X 2 COMPARISON:  Previous exam(s). FINDINGS: Patient presents for radioactive seed localizations prior to bilateral breast excisions. I met with the patient and we discussed the procedures of seed localization including benefits and alternatives. We discussed the high likelihood of successful procedures. We discussed the risks of the procedures including infection, bleeding, tissue injury and further surgery. We discussed the low dose of radioactivity involved in the procedures. Informed, written consent was given. The usual time-out protocol was performed immediately prior to the procedures. RADIOACTIVE SEED LOCALIZATION RIGHT BREAST: Using mammographic guidance, sterile technique, 1% lidocaine and an I-125 radioactive seed, the COIL clip was localized using a SUPERIOR approach. The follow-up mammogram images confirm the seed in the expected location and were marked for Dr. Brantley Stage. Follow-up survey of the patient confirms presence of the radioactive seed. Order number of I-125 seed:  [id number]. Total activity:  9.629 millicuries.  Reference Date: 05/31/2018 RADIOACTIVE SEED LOCALIZATION LEFT BREAST #1: Using mammographic guidance, sterile technique, 1% lidocaine and an I-125 radioactive seed, the COIL clip was localized using a LATERAL approach. The follow-up mammogram images confirm the seed in the expected location and were marked for Dr. Brantley Stage. Follow-up survey  of the patient confirms presence of the radioactive seed. Order number of I-125 seed:  [id number]. Total activity:  0.102 millicurie.  Reference Date: 05/31/2018 RADIOACTIVE SEED LOCALIZATION LEFT BREAST #2: Using mammographic guidance, sterile technique, 1% lidocaine and an I-125 radioactive seed, the X clip was localized using a LATERAL approach. The follow-up mammogram images confirm the seed in the expected location and were marked for Dr. Brantley Stage. Follow-up survey of the patient confirms presence of the radioactive seed. Order number of I-125 seed:  [id number]. Total activity:  3.474 millicuries.  Reference Date: 05/03/2018 The patient tolerated the procedures well and was released from the [facility]. She was given instructions regarding seed removal. IMPRESSION: Radioactive seed localization - 1 site within the RIGHT breast and 2 sites within the LEFT breast. No apparent complications. Electronically Signed   By: Margarette Canada M.D.   On: 06/06/2018 14:16   Mm Rt Radioactive Seed Loc Mammo Guide  Result Date: 06/06/2018 CLINICAL DATA:  52 year old female for radioactive seed localization of flat epithelial atypia within both breasts. Also for radioactive seed localization of discordant biopsy of calcifications within the LEFT breast. EXAM: MAMMOGRAPHIC GUIDED RADIOACTIVE SEED LOCALIZATION OF THE RIGHT BREAST MAMMOGRAPHIC GUIDED RADIOACTIVE SEED LOCALIZATION OF THE LEFT BREAST X 2 COMPARISON:  Previous exam(s). FINDINGS: Patient presents for radioactive seed localizations prior to bilateral breast excisions. I met with the patient and we discussed the procedures of seed localization including benefits and alternatives. We discussed the high likelihood of successful procedures. We discussed the risks of the procedures including infection, bleeding, tissue injury and further surgery. We discussed the low dose of radioactivity involved in the procedures. Informed, written consent was given. The usual time-out  protocol was performed immediately prior to the procedures. RADIOACTIVE SEED LOCALIZATION RIGHT BREAST: Using mammographic guidance, sterile technique, 1% lidocaine and an I-125 radioactive seed, the COIL clip was localized using a SUPERIOR approach. The follow-up mammogram images confirm the seed in the expected location and were marked for Dr. Brantley Stage. Follow-up survey of the patient confirms presence of the radioactive seed. Order number of I-125 seed:  [id number]. Total activity:  7.062 millicuries.  Reference Date: 05/31/2018 RADIOACTIVE SEED LOCALIZATION LEFT BREAST #1: Using mammographic guidance, sterile technique, 1% lidocaine and an I-125 radioactive seed, the COIL clip was localized using a LATERAL approach. The follow-up mammogram images confirm the seed in the expected location and were marked for Dr. Brantley Stage. Follow-up survey of the patient confirms presence of the radioactive seed. Order number of I-125 seed:  [id number]. Total activity:  7.616 millicurie.  Reference Date: 05/31/2018 RADIOACTIVE SEED LOCALIZATION LEFT BREAST #2: Using mammographic guidance, sterile technique, 1% lidocaine and an I-125 radioactive seed, the X clip was localized using a LATERAL approach. The follow-up mammogram images confirm the seed in the expected location and were marked for Dr. Brantley Stage. Follow-up survey of the patient confirms presence of the radioactive seed. Order number of I-125 seed:  [id number]. Total activity:  9.485 millicuries.  Reference Date: 05/03/2018 The patient tolerated the procedures well and was released from the [facility]. She was given instructions regarding seed removal. IMPRESSION: Radioactive seed localization - 1 site within the RIGHT breast and 2 sites within the LEFT breast. No apparent complications. Electronically Signed   By: Margarette Canada M.D.   On: 06/06/2018 14:16    Review of Systems  All other systems reviewed and are negative.   Blood pressure (!) 154/97, pulse 68,  temperature 98.4 F (36.9 C), temperature source Oral, resp. rate 20, height _0  (1.575 m), weight 64.6 kg, SpO2 100 %. Physical Exam  Constitutional: She appears well-developed.  HENT:  Head: Normocephalic.  Eyes: Pupils are equal, round, and reactive to light.  Neck: Normal range of motion.  Respiratory: Right breast exhibits no inverted nipple, no mass and no nipple discharge. Left breast exhibits no inverted nipple, no mass and no nipple discharge.  Musculoskeletal: Normal range of motion.  Neurological: She is alert.  Skin: Skin is warm and dry.  Psychiatric: She has a normal mood and affect. Her behavior is normal.     Assessment/Plan Bilateral breast atypical microcalcification  Plan seed localized lumpectomy bilaterally  The procedure has been discussed with the patient. Alternatives to surgery have been discussed with the patient.  Risks of surgery include bleeding,  Infection,  Seroma formation, death,  and the need for further surgery.   The patient understands and wishes to proceed.  Turner Daniels, MD 06/08/2018, 8:54 AM

## 2018-06-08 NOTE — Op Note (Addendum)
Preoperative diagnosis: Bilateral abnormal mammography And bilateral breast masses   Postop diagnosis same  Procedure: Left breast seed localized lumpectomy x2 and right breast seed localized lumpectomy x1  Surgeon: Thomas Cornett, MD  Anesthesia: General bilateral pectoral blocks and local anesthetic  EBL: Minimal  Specimen: Left breast tissue with both seeds and clip in specimen verified by Faxitron and right breast tissue with seed and clip verified by Faxitron.  All were sent to pathology  Drains: None  IV fluids: Per anesthesia record  Indications for procedure: The patient is a 52-year-old female with an abnormal screening mammogram.  She had 2 areas of density in the left breast in the upper outer quadrant and one in the right central breast.  All 3 were biopsied and found to be flat atypia.  Excision recommended to exclude malignancy.  Upgrade risk roughly of 1 and 5 to a more malignant diagnosis.  Risk, benefits and other options discussed with the patient with the help of a translator.  She understood the above and agreed to proceed.The procedure has been discussed with the patient. Alternatives to surgery have been discussed with the patient.  Risks of surgery include bleeding,  Infection,  Seroma formation, death,  and the need for further surgery.   The patient understands and wishes to proceed.    Description of procedure: The patient was met in the holding area and questions were answered.  Translator was available to answer questions.  Neoprobe was used and all 3 seeds were identified to in the left one on the right.  She was taken back to the operating room placed supine upon the operating room table.  After induction of general anesthesia both breasts were prepped and draped in sterile fashion timeout was done.  Of note she underwent bilateral pectoral block in the holding area by anesthesia.  The left side was done first.  Neoprobe was used in both seeds were identified in  the left breast upper outer quadrant.  Incision made the left axilla dissection was carried down all tissue around both seeds and clips were excised as one specimen.  One seed was dislodged and was sent separately.  Radiograph revealed both clips in the one clip left in the tissue had both seeds adjacent to it.  Hemostasis achieved.  Cavity was made hemostatic and then closed with 3-0 Vicryl and 4-0 Monocryl.  The right breast was done in a similar fashion.  The seed was localized under the right nipple.  Curvilinear incision was made along the superior border of the nipple areolar complex.  Dissection was carried around all tissue both seed and clip were removed.  Radiograph revealed both seed and clip to be in the specimen and all tissue with seeds and clip were sent to pathology.  Hemostasis achieved with cautery and wound closed with 3-0 Vicryl and 4-0 Monocryl.  Local anesthetic infiltrated in the both wounds.  All final counts found to be correct.  Dermabond applied.  The patient was awoke extubated taken recovery in satisfactory condition after placement of a breast binder. 

## 2018-06-08 NOTE — Anesthesia Preprocedure Evaluation (Signed)
Anesthesia Evaluation  Patient identified by MRN, date of birth, ID band Patient awake    Reviewed: Allergy & Precautions, NPO status , Patient's Chart, lab work & pertinent test results  Airway Mallampati: II  TM Distance: >3 FB Neck ROM: Full    Dental no notable dental hx.    Pulmonary neg pulmonary ROS,    Pulmonary exam normal breath sounds clear to auscultation       Cardiovascular hypertension, Pt. on medications negative cardio ROS Normal cardiovascular exam Rhythm:Regular Rate:Normal     Neuro/Psych negative neurological ROS  negative psych ROS   GI/Hepatic negative GI ROS, Neg liver ROS,   Endo/Other  negative endocrine ROS  Renal/GU negative Renal ROS  negative genitourinary   Musculoskeletal negative musculoskeletal ROS (+)   Abdominal   Peds negative pediatric ROS (+)  Hematology negative hematology ROS (+)   Anesthesia Other Findings   Reproductive/Obstetrics negative OB ROS                             Anesthesia Physical Anesthesia Plan  ASA: II  Anesthesia Plan: General   Post-op Pain Management:  Regional for Post-op pain   Induction: Intravenous  PONV Risk Score and Plan: 3 and Ondansetron, Dexamethasone and Midazolam  Airway Management Planned: LMA  Additional Equipment:   Intra-op Plan:   Post-operative Plan: Extubation in OR  Informed Consent: I have reviewed the patients History and Physical, chart, labs and discussed the procedure including the risks, benefits and alternatives for the proposed anesthesia with the patient or authorized representative who has indicated his/her understanding and acceptance.   Dental advisory given  Plan Discussed with: CRNA  Anesthesia Plan Comments:         Anesthesia Quick Evaluation

## 2018-06-09 ENCOUNTER — Encounter (HOSPITAL_BASED_OUTPATIENT_CLINIC_OR_DEPARTMENT_OTHER): Payer: Self-pay | Admitting: Surgery

## 2018-06-09 ENCOUNTER — Other Ambulatory Visit: Payer: Self-pay | Admitting: Family Medicine

## 2018-06-09 NOTE — Addendum Note (Signed)
Addendum  created 06/09/18 0828 by Burna Cashonrad, Malakhi Markwood C, CRNA   Charge Capture section accepted

## 2018-06-23 ENCOUNTER — Telehealth: Payer: Self-pay | Admitting: Family Medicine

## 2018-06-23 NOTE — Telephone Encounter (Signed)
**  LVM FOR PATIENT TO CALL BACK AND RESCHEDULE DUE TO PROVIDER BEING OUT ON 08/15/18**CC

## 2018-07-10 ENCOUNTER — Encounter: Payer: Self-pay | Admitting: Emergency Medicine

## 2018-08-15 ENCOUNTER — Ambulatory Visit: Payer: BLUE CROSS/BLUE SHIELD | Admitting: Family Medicine

## 2018-10-17 ENCOUNTER — Ambulatory Visit: Payer: BLUE CROSS/BLUE SHIELD | Admitting: Family Medicine

## 2019-03-05 ENCOUNTER — Other Ambulatory Visit: Payer: Self-pay | Admitting: Family Medicine

## 2019-03-19 ENCOUNTER — Telehealth (INDEPENDENT_AMBULATORY_CARE_PROVIDER_SITE_OTHER): Payer: BC Managed Care – PPO | Admitting: Family Medicine

## 2019-03-19 ENCOUNTER — Other Ambulatory Visit: Payer: Self-pay

## 2019-03-19 ENCOUNTER — Encounter: Payer: Self-pay | Admitting: Family Medicine

## 2019-03-19 VITALS — Ht 62.99 in | Wt 150.0 lb

## 2019-03-19 DIAGNOSIS — I10 Essential (primary) hypertension: Secondary | ICD-10-CM | POA: Diagnosis not present

## 2019-03-19 MED ORDER — LISINOPRIL 10 MG PO TABS: 10.0000 mg | ORAL_TABLET | Freq: Every day | ORAL | 1 refills | Status: DC

## 2019-03-19 NOTE — Progress Notes (Signed)
   Virtual Visit Note  I connected with patient on 03/19/19 at 545pm by phone and verified that I am speaking with the correct person using two identifiers. Linda Henry is currently located at home and patient is currently with them during visit. The provider, Rutherford Guys, MD is located in their office at time of visit.  I discussed the limitations, risks, security and privacy concerns of performing an evaluation and management service by telephone and the availability of in person appointments. I also discussed with the patient that there may be a patient responsible charge related to this service. The patient expressed understanding and agreed to proceed.   CC: HTN  HPI ? Last OV July 2019 Had surgery in Nov 2019, bilateral lumpectomy, benign Denies any cp, sob, palpitations, edema  Lab Results  Component Value Date   CREATININE 0.73 02/14/2018   BUN 10 02/14/2018   NA 142 02/14/2018   K 4.1 02/14/2018   CL 107 (H) 02/14/2018   CO2 22 02/14/2018    BP Readings from Last 3 Encounters:  06/08/18 (!) 143/91  02/14/18 112/72  08/23/17 126/70    No Known Allergies  Prior to Admission medications   Medication Sig Start Date End Date Taking? Authorizing Provider  lisinopril (PRINIVIL,ZESTRIL) 10 MG tablet Take 1 tablet (10 mg total) by mouth daily. 02/14/18  Yes Rutherford Guys, MD    Past Medical History:  Diagnosis Date  . Breast mass    bilateral breasts  . Hypertension     Past Surgical History:  Procedure Laterality Date  . ABDOMINAL HYSTERECTOMY  2013   partial, for benign resons, cervix and ovaries not removed, [place]  . BREAST LUMPECTOMY WITH RADIOACTIVE SEED LOCALIZATION Bilateral 06/08/2018   Procedure: LEFT BREAST LUMPECTOMY WITH RADIOACTIVE SEED LOCALIZATION X2 AND RIGHT BREAST LUMPECTOMY WITH RADIOACTIVE SEED LOCALIZATION;  Surgeon: Erroll Luna, MD;  Location: [facility];  Service: General;  Laterality: Bilateral;   PECTORAL BLOCK  . CESAREAN SECTION     x2    Social History   Tobacco Use  . Smoking status: Never Smoker  . Smokeless tobacco: Never Used  Substance Use Topics  . Alcohol use: No    Alcohol/week: 0.0 standard drinks    Family History  Problem Relation Age of Onset  . Healthy Mother   . Healthy Father     ROS Per hpi  Objective  Vitals as reported by the patient: none   ASSESSMENT and PLAN  1. Essential hypertension, benign Refilled meds. Needs in office visit.  Other orders - lisinopril (ZESTRIL) 10 MG tablet; Take 1 tablet (10 mg total) by mouth daily.  FOLLOW-UP: IN OFFICE within the next 2 months   The above assessment and management plan was discussed with the patient. The patient verbalized understanding of and has agreed to the management plan. Patient is aware to call the clinic if symptoms persist or worsen. Patient is aware when to return to the clinic for a follow-up visit. Patient educated on when it is appropriate to go to the emergency department.    I provided 10 minutes of non-face-to-face time during this encounter.  Rutherford Guys, MD [facility], [place] [address] Ph.  [phone] Fax [phone]

## 2019-03-19 NOTE — Progress Notes (Signed)
Med refill- Lisinopril

## 2019-03-19 NOTE — Patient Instructions (Signed)
° ° ° °  If you have lab work done today you will be contacted with your lab results within the next 2 weeks.  If you have not heard from us then please contact us. The fastest way to get your results is to register for My Chart. ° ° °IF you received an x-ray today, you will receive an invoice from [facility]. Please contact [facility] at [phone] with questions or concerns regarding your invoice.  ° °IF you received labwork today, you will receive an invoice from [facility]. Please contact [facility] at [phone] with questions or concerns regarding your invoice.  ° °Our billing staff will not be able to assist you with questions regarding bills from these companies. ° °You will be contacted with the lab results as soon as they are available. The fastest way to get your results is to activate your My Chart account. Instructions are located on the last page of this paperwork. If you have not heard from us regarding the results in 2 weeks, please contact this office. °  ° ° ° °

## 2019-03-22 ENCOUNTER — Telehealth: Payer: Self-pay | Admitting: Family Medicine

## 2019-03-22 NOTE — Telephone Encounter (Signed)
Called pt to schedule in office visit. Pt has not been seen since 01/2018 and telemed is not appropriate

## 2019-04-26 ENCOUNTER — Ambulatory Visit: Payer: BC Managed Care – PPO | Admitting: Family Medicine

## 2019-04-26 ENCOUNTER — Other Ambulatory Visit: Payer: Self-pay

## 2019-04-26 ENCOUNTER — Encounter: Payer: Self-pay | Admitting: Family Medicine

## 2019-04-26 VITALS — BP 135/89 | HR 87 | Temp 98.5°F | Ht 62.99 in | Wt 148.2 lb

## 2019-04-26 DIAGNOSIS — I10 Essential (primary) hypertension: Secondary | ICD-10-CM

## 2019-04-26 DIAGNOSIS — Z131 Encounter for screening for diabetes mellitus: Secondary | ICD-10-CM | POA: Diagnosis not present

## 2019-04-26 MED ORDER — LISINOPRIL 10 MG PO TABS: 10.0000 mg | ORAL_TABLET | Freq: Every day | ORAL | 1 refills | Status: DC

## 2019-04-26 NOTE — Patient Instructions (Signed)
° ° ° °  If you have lab work done today you will be contacted with your lab results within the next 2 weeks.  If you have not heard from us then please contact us. The fastest way to get your results is to register for My Chart. ° ° °IF you received an x-ray today, you will receive an invoice from [facility]. Please contact [facility] at [phone] with questions or concerns regarding your invoice.  ° °IF you received labwork today, you will receive an invoice from [facility]. Please contact [facility] at [phone] with questions or concerns regarding your invoice.  ° °Our billing staff will not be able to assist you with questions regarding bills from these companies. ° °You will be contacted with the lab results as soon as they are available. The fastest way to get your results is to activate your My Chart account. Instructions are located on the last page of this paperwork. If you have not heard from us regarding the results in 2 weeks, please contact this office. °  ° ° ° °

## 2019-04-26 NOTE — Progress Notes (Signed)
10/1/20204:00 PM  Linda Henry April 06, 1966, 53 y.o., female [id number]  Chief Complaint  Patient presents with  . Hypertension    needs refill on lisinopril, pt is decling lab today, she prefers fasting labs. She will make an appt for lab. Has not taken the medication in 2 days    HPI:   Patient is a 53 y.o. female with past medical history significant for HTN who presents today for followup  Last OV in person July 2019 Had telemedicine visit in Aug 2020 Has been without BP meds for about 2 days Gets flu vaccine at work Not fasting  Depression screen [facility] 2/9 04/26/2019 03/19/2019 08/23/2017  Decreased Interest 0 0 0  Down, Depressed, Hopeless 0 0 0  PHQ - 2 Score 0 0 0    Fall Risk  04/26/2019 03/19/2019 08/23/2017 05/24/2017 11/25/2015  Falls in the past year? 0 0 No No No  Number falls in past yr: 0 0 - - -  Injury with Fall? 0 0 - - -     No Known Allergies  Prior to Admission medications   Medication Sig Start Date End Date Taking? Authorizing Provider  lisinopril (ZESTRIL) 10 MG tablet Take 1 tablet (10 mg total) by mouth daily. 03/19/19  Yes Rutherford Guys, MD    Past Medical History:  Diagnosis Date  . Breast mass    bilateral breasts  . Hypertension     Past Surgical History:  Procedure Laterality Date  . ABDOMINAL HYSTERECTOMY  2013   partial, for benign resons, cervix and ovaries not removed, [place]  . BREAST LUMPECTOMY WITH RADIOACTIVE SEED LOCALIZATION Bilateral 06/08/2018   Procedure: LEFT BREAST LUMPECTOMY WITH RADIOACTIVE SEED LOCALIZATION X2 AND RIGHT BREAST LUMPECTOMY WITH RADIOACTIVE SEED LOCALIZATION;  Surgeon: Erroll Luna, MD;  Location: [facility];  Service: General;  Laterality: Bilateral;  PECTORAL BLOCK  . CESAREAN SECTION     x2    Social History   Tobacco Use  . Smoking status: Never Smoker  . Smokeless tobacco: Never Used  Substance Use Topics  . Alcohol use: No    Alcohol/week: 0.0 standard drinks     Family History  Problem Relation Age of Onset  . Healthy Mother   . Healthy Father     Review of Systems  Constitutional: Negative for chills and fever.  Respiratory: Negative for cough and shortness of breath.   Cardiovascular: Negative for chest pain, palpitations and leg swelling.  Gastrointestinal: Negative for abdominal pain, nausea and vomiting.     OBJECTIVE:  Today's Vitals   04/26/19 1552  BP: 135/89  Pulse: 87  Temp: 98.5 F (36.9 C)  SpO2: 96%  Weight: 148 lb 3.2 oz (67.2 kg)  Height: 5' 2.99" (1.6 m)   Body mass index is 26.26 kg/m.   Physical Exam Vitals signs and nursing note reviewed.  Constitutional:      Appearance: She is well-developed.  HENT:     Head: Normocephalic and atraumatic.     Mouth/Throat:     Pharynx: No oropharyngeal exudate.  Eyes:     General: No scleral icterus.    Conjunctiva/sclera: Conjunctivae normal.     Pupils: Pupils are equal, round, and reactive to light.  Neck:     Musculoskeletal: Neck supple.  Cardiovascular:     Rate and Rhythm: Normal rate and regular rhythm.     Heart sounds: Normal heart sounds. No murmur. No friction rub. No gallop.   Pulmonary:     Effort:  Pulmonary effort is normal.     Breath sounds: Normal breath sounds. No wheezing or rales.  Skin:    General: Skin is warm and dry.  Neurological:     Mental Status: She is alert and oriented to person, place, and time.     No results found for this or any previous visit (from the past 24 hour(s)).  No results found.   ASSESSMENT and PLAN  1. Essential hypertension, benign Controlled. Continue current regime.  - Lipid panel - CMP14+EGFR  2. Screening for diabetes mellitus - Hemoglobin A1c  Other orders - lisinopril (ZESTRIL) 10 MG tablet; Take 1 tablet (10 mg total) by mouth daily.  Return in about 4 weeks (around 05/24/2019) for CPE with pap.    Rutherford Guys, MD Primary Care at [place], [place] [address]  Ph.  [phone] Fax [phone]

## 2019-04-27 LAB — CMP14+EGFR
ALT: 13 IU/L (ref 0–32)
AST: 16 IU/L (ref 0–40)
Albumin/Globulin Ratio: 1.8 (ref 1.2–2.2)
Albumin: 4.7 g/dL (ref 3.8–4.9)
Alkaline Phosphatase: 68 IU/L (ref 39–117)
BUN/Creatinine Ratio: 20 (ref 9–23)
BUN: 15 mg/dL (ref 6–24)
Bilirubin Total: 0.3 mg/dL (ref 0.0–1.2)
CO2: 21 mmol/L (ref 20–29)
Calcium: 9.5 mg/dL (ref 8.7–10.2)
Chloride: 103 mmol/L (ref 96–106)
Creatinine, Ser: 0.76 mg/dL (ref 0.57–1.00)
GFR calc Af Amer: 103 mL/min/{1.73_m2} (ref >59)
GFR calc non Af Amer: 89 mL/min/{1.73_m2} (ref >59)
Globulin, Total: 2.6 g/dL (ref 1.5–4.5)
Glucose: 85 mg/dL (ref 65–99)
Potassium: 4.2 mmol/L (ref 3.5–5.2)
Sodium: 138 mmol/L (ref 134–144)
Total Protein: 7.3 g/dL (ref 6.0–8.5)

## 2019-04-27 LAB — LIPID PANEL
Chol/HDL Ratio: 3.9 ratio (ref 0.0–4.4)
Cholesterol, Total: 172 mg/dL (ref 100–199)
HDL: 44 mg/dL (ref >39)
LDL Chol Calc (NIH): 113 mg/dL — ABNORMAL HIGH (ref 0–99)
Triglycerides: 80 mg/dL (ref 0–149)
VLDL Cholesterol Cal: 15 mg/dL (ref 5–40)

## 2019-04-27 LAB — HEMOGLOBIN A1C
Est. average glucose Bld gHb Est-mCnc: 120 mg/dL
Hgb A1c MFr Bld: 5.8 % — ABNORMAL HIGH (ref 4.8–5.6)

## 2019-05-08 ENCOUNTER — Encounter: Payer: Self-pay | Admitting: Radiology

## 2019-05-24 ENCOUNTER — Other Ambulatory Visit: Payer: Self-pay

## 2019-05-24 ENCOUNTER — Ambulatory Visit (INDEPENDENT_AMBULATORY_CARE_PROVIDER_SITE_OTHER): Payer: BC Managed Care – PPO | Admitting: Family Medicine

## 2019-05-24 ENCOUNTER — Encounter: Payer: Self-pay | Admitting: Family Medicine

## 2019-05-24 ENCOUNTER — Other Ambulatory Visit (HOSPITAL_COMMUNITY)
Admission: RE | Admit: 2019-05-24 | Discharge: 2019-05-24 | Disposition: A | Discharge status: Home / Self Care | Payer: BC Managed Care – PPO | Source: Ambulatory Visit | Attending: Family Medicine | Admitting: Family Medicine

## 2019-05-24 ENCOUNTER — Telehealth: Payer: Self-pay | Admitting: Family Medicine

## 2019-05-24 VITALS — BP 127/80 | HR 98 | Temp 98.1°F | Ht 62.99 in | Wt 143.6 lb

## 2019-05-24 DIAGNOSIS — Z01419 Encounter for gynecological examination (general) (routine) without abnormal findings: Secondary | ICD-10-CM

## 2019-05-24 DIAGNOSIS — Z01411 Encounter for gynecological examination (general) (routine) with abnormal findings: Secondary | ICD-10-CM

## 2019-05-24 DIAGNOSIS — Z1151 Encounter for screening for human papillomavirus (HPV): Secondary | ICD-10-CM | POA: Insufficient documentation

## 2019-05-24 DIAGNOSIS — Z1231 Encounter for screening mammogram for malignant neoplasm of breast: Secondary | ICD-10-CM | POA: Diagnosis not present

## 2019-05-24 DIAGNOSIS — I10 Essential (primary) hypertension: Secondary | ICD-10-CM

## 2019-05-24 DIAGNOSIS — Z1211 Encounter for screening for malignant neoplasm of colon: Secondary | ICD-10-CM

## 2019-05-24 MED ORDER — LISINOPRIL 10 MG PO TABS: 10.0000 mg | ORAL_TABLET | Freq: Every day | ORAL | 1 refills | Status: DC

## 2019-05-24 NOTE — Patient Instructions (Addendum)
   If you have lab work done today you will be contacted with your lab results within the next 2 weeks.  If you have not heard from us then please contact us. The fastest way to get your results is to register for My Chart.   IF you received an x-ray today, you will receive an invoice from [facility]. Please contact [facility] at [phone] with questions or concerns regarding your invoice.   IF you received labwork today, you will receive an invoice from [facility]. Please contact [facility] at [phone] with questions or concerns regarding your invoice.   Our billing staff will not be able to assist you with questions regarding bills from these companies.  You will be contacted with the lab results as soon as they are available. The fastest way to get your results is to activate your My Chart account. Instructions are located on the last page of this paperwork. If you have not heard from us regarding the results in 2 weeks, please contact this office.     Preventive Care 40-64 Years Old, Female Preventive care refers to visits with your health care provider and lifestyle choices that can promote health and wellness. This includes:  A yearly physical exam. This may also be called an annual well check.  Regular dental visits and eye exams.  Immunizations.  Screening for certain conditions.  Healthy lifestyle choices, such as eating a healthy diet, getting regular exercise, not using drugs or products that contain nicotine and tobacco, and limiting alcohol use. What can I expect for my preventive care visit? Physical exam Your health care provider will check your:  Height and weight. This may be used to calculate body mass index (BMI), which tells if you are at a healthy weight.  Heart rate and blood pressure.  Skin for abnormal spots. Counseling Your health care provider may ask you questions about your:  Alcohol, tobacco, and drug use.  Emotional  well-being.  Home and relationship well-being.  Sexual activity.  Eating habits.  Work and work environment.  Method of birth control.  Menstrual cycle.  Pregnancy history. What immunizations do I need?  Influenza (flu) vaccine  This is recommended every year. Tetanus, diphtheria, and pertussis (Tdap) vaccine  You may need a Td booster every 10 years. Varicella (chickenpox) vaccine  You may need this if you have not been vaccinated. Zoster (shingles) vaccine  You may need this after age 60. Measles, mumps, and rubella (MMR) vaccine  You may need at least one dose of MMR if you were born in 1957 or later. You may also need a second dose. Pneumococcal conjugate (PCV13) vaccine  You may need this if you have certain conditions and were not previously vaccinated. Pneumococcal polysaccharide (PPSV23) vaccine  You may need one or two doses if you smoke cigarettes or if you have certain conditions. Meningococcal conjugate (MenACWY) vaccine  You may need this if you have certain conditions. Hepatitis A vaccine  You may need this if you have certain conditions or if you travel or work in places where you may be exposed to hepatitis A. Hepatitis B vaccine  You may need this if you have certain conditions or if you travel or work in places where you may be exposed to hepatitis B. Haemophilus influenzae type b (Hib) vaccine  You may need this if you have certain conditions. Human papillomavirus (HPV) vaccine  If recommended by your health care provider, you may need three doses over 6 months.   You may receive vaccines as individual doses or as more than one vaccine together in one shot (combination vaccines). Talk with your health care provider about the risks and benefits of combination vaccines. What tests do I need? Blood tests  Lipid and cholesterol levels. These may be checked every 5 years, or more frequently if you are over 50 years old.  Hepatitis C  test.  Hepatitis B test. Screening  Lung cancer screening. You may have this screening every year starting at age 55 if you have a 30-pack-year history of smoking and currently smoke or have quit within the past 15 years.  Colorectal cancer screening. All adults should have this screening starting at age 50 and continuing until age 75. Your health care provider may recommend screening at age 45 if you are at increased risk. You will have tests every 1-10 years, depending on your results and the type of screening test.  Diabetes screening. This is done by checking your blood sugar (glucose) after you have not eaten for a while (fasting). You may have this done every 1-3 years.  Mammogram. This may be done every 1-2 years. Talk with your health care provider about when you should start having regular mammograms. This may depend on whether you have a family history of breast cancer.  BRCA-related cancer screening. This may be done if you have a family history of breast, ovarian, tubal, or peritoneal cancers.  Pelvic exam and Pap test. This may be done every 3 years starting at age 21. Starting at age 30, this may be done every 5 years if you have a Pap test in combination with an HPV test. Other tests  Sexually transmitted disease (STD) testing.  Bone density scan. This is done to screen for osteoporosis. You may have this scan if you are at high risk for osteoporosis. Follow these instructions at home: Eating and drinking  Eat a diet that includes fresh fruits and vegetables, whole grains, lean protein, and low-fat dairy.  Take vitamin and mineral supplements as recommended by your health care provider.  Do not drink alcohol if: ? Your health care provider tells you not to drink. ? You are pregnant, may be pregnant, or are planning to become pregnant.  If you drink alcohol: ? Limit how much you have to 0-1 drink a day. ? Be aware of how much alcohol is in your drink. In the [place] one  drink equals one 12 oz bottle of beer (355 mL), one 5 oz glass of wine (148 mL), or one 1 oz glass of hard liquor (44 mL). Lifestyle  Take daily care of your teeth and gums.  Stay active. Exercise for at least 30 minutes on 5 or more days each week.  Do not use any products that contain nicotine or tobacco, such as cigarettes, e-cigarettes, and chewing tobacco. If you need help quitting, ask your health care provider.  If you are sexually active, practice safe sex. Use a condom or other form of birth control (contraception) in order to prevent pregnancy and STIs (sexually transmitted infections).  If told by your health care provider, take low-dose aspirin daily starting at age 50. What's next?  Visit your health care provider once a year for a well check visit.  Ask your health care provider how often you should have your eyes and teeth checked.  Stay up to date on all vaccines. This information is not intended to replace advice given to you by your health care provider. Make sure   you discuss any questions you have with your health care provider. Document Released: 08/08/2015 Document Revised: 03/23/2018 Document Reviewed: 03/23/2018 Elsevier Patient Education  2020 Elsevier Inc.  

## 2019-05-24 NOTE — Progress Notes (Signed)
10/29/20208:56 AM  Linda Henry January 09, 1966, 53 y.o., female [id number]  Chief Complaint  Patient presents with  . Annual Exam    HPI:   Patient is a 53 y.o. female with past medical history significant for HTN and prediabetes who presents today for WWE  Last CPE July 2019 Cervical Cancer Screening: supracervical hyst, ovaries remain, last pap 11/2015, negative, denies h/o abnormal Breast Cancer Screening: 2019, lumpectomy done nov 2019 for microcalcification of LEFT breast - benign Colorectal Cancer Screening: hasnever had, referred for colonoscopy last year, denies fhx Bone Density Testing: at age 84 HIV Screening: 2019 Seasonal Influenza Vaccination: gets at work Td/Tdap Vaccination: jan 2019 Pneumococcal Vaccination: at age 63 Zoster Vaccination: declines today Frequency of Dental evaluation: does in [place], has not been able to travel due to covid Frequency of Eye evaluation: does in [place], has not been able to travel due to covid   Depression screen [facility] 2/9 05/24/2019 04/26/2019 03/19/2019  Decreased Interest 0 0 0  Down, Depressed, Hopeless 0 0 0  PHQ - 2 Score 0 0 0    Fall Risk  05/24/2019 04/26/2019 03/19/2019 08/23/2017 05/24/2017  Falls in the past year? 0 0 0 No No  Number falls in past yr: 0 0 0 - -  Injury with Fall? 0 0 0 - -     No Known Allergies  Prior to Admission medications   Medication Sig Start Date End Date Taking? Authorizing Provider  lisinopril (ZESTRIL) 10 MG tablet Take 1 tablet (10 mg total) by mouth daily. 04/26/19  Yes Myles Lipps, MD    Past Medical History:  Diagnosis Date  . Breast mass    bilateral breasts  . Hypertension     Past Surgical History:  Procedure Laterality Date  . ABDOMINAL HYSTERECTOMY  2013   partial, for benign resons, cervix and ovaries not removed, [place]  . BREAST LUMPECTOMY WITH RADIOACTIVE SEED LOCALIZATION Bilateral 06/08/2018   Procedure: LEFT BREAST LUMPECTOMY WITH RADIOACTIVE  SEED LOCALIZATION X2 AND RIGHT BREAST LUMPECTOMY WITH RADIOACTIVE SEED LOCALIZATION;  Surgeon: Harriette Bouillon, MD;  Location: [facility];  Service: General;  Laterality: Bilateral;  PECTORAL BLOCK  . CESAREAN SECTION     x2    Social History   Tobacco Use  . Smoking status: Never Smoker  . Smokeless tobacco: Never Used  Substance Use Topics  . Alcohol use: No    Alcohol/week: 0.0 standard drinks    Family History  Problem Relation Age of Onset  . Healthy Mother   . Healthy Father     Review of Systems  Constitutional: Negative for chills and fever.  Respiratory: Negative for cough and shortness of breath.   Cardiovascular: Negative for chest pain, palpitations and leg swelling.  Gastrointestinal: Negative for abdominal pain, nausea and vomiting.  All other systems reviewed and are negative.    OBJECTIVE:  Today's Vitals   05/24/19 0852  BP: 127/80  Pulse: 98  Temp: 98.1 F (36.7 C)  SpO2: 97%  Weight: 143 lb 9.6 oz (65.1 kg)  Height: 5' 2.99" (1.6 m)   Body mass index is 25.45 kg/m.   Physical Exam Vitals signs and nursing note reviewed. Exam conducted with a chaperone present.  Constitutional:      Appearance: She is well-developed.  HENT:     Head: Normocephalic and atraumatic.     Right Ear: Hearing, tympanic membrane, ear canal and external ear normal.     Left Ear: Hearing, tympanic membrane, ear canal  and external ear normal.  Eyes:     Conjunctiva/sclera: Conjunctivae normal.     Pupils: Pupils are equal, round, and reactive to light.  Neck:     Musculoskeletal: Neck supple.     Thyroid: No thyromegaly.  Cardiovascular:     Rate and Rhythm: Normal rate and regular rhythm.     Pulses: Normal pulses.     Heart sounds: Normal heart sounds. No murmur. No friction rub. No gallop.   Pulmonary:     Effort: Pulmonary effort is normal.     Breath sounds: Normal breath sounds. No wheezing, rhonchi or rales.  Chest:     Breasts: Breasts  are symmetrical.        Right: No inverted nipple, mass, nipple discharge, skin change or tenderness.        Left: No inverted nipple, mass, nipple discharge, skin change or tenderness.  Abdominal:     General: Bowel sounds are normal. There is no distension.     Palpations: Abdomen is soft. There is no hepatomegaly, splenomegaly or mass.     Tenderness: There is no abdominal tenderness. There is no guarding.     Hernia: No hernia is present.  Genitourinary:    Labia:        Right: No rash or lesion.        Left: No rash or lesion.      Vagina: No vaginal discharge or erythema.     Cervix: No cervical motion tenderness, discharge or friability.     Uterus: Absent.      Adnexa:        Right: No mass or tenderness.         Left: No mass or tenderness.    Musculoskeletal: Normal range of motion.     Right lower leg: No edema.     Left lower leg: No edema.  Lymphadenopathy:     Cervical: No cervical adenopathy.     Upper Body:     Right upper body: No supraclavicular, axillary or pectoral adenopathy.     Left upper body: No supraclavicular, axillary or pectoral adenopathy.  Skin:    General: Skin is warm and dry.  Neurological:     General: No focal deficit present.     Mental Status: She is alert and oriented to person, place, and time.     Cranial Nerves: No cranial nerve deficit.     Gait: Gait normal.     Deep Tendon Reflexes: Reflexes are normal and symmetric.  Psychiatric:        Mood and Affect: Mood normal.        Behavior: Behavior normal.     No results found for this or any previous visit (from the past 24 hour(s)).  No results found.   ASSESSMENT and PLAN  1. Women's annual routine gynecological examination No concerns per history or exam. Routine HCM labs ordered. HCM reviewed/discussed. Anticipatory guidance regarding healthy weight, lifestyle and choices given.  - Cytology - PAP([facility])  2. Colon cancer screening - IFOBT POC (occult bld, rslt in  office); Future  3. Visit for screening mammogram - MM DIGITAL SCREENING BILATERAL; Future  4. Essential hypertension, benign Controlled. Continue current regime.  - lisinopril (ZESTRIL) 10 MG tablet; Take 1 tablet (10 mg total) by mouth daily.  Return in about 6 months (around 11/22/2019).    Rutherford Guys, MD Primary Care at [place], [place] [address] Ph.  [phone] Fax [phone]

## 2019-05-24 NOTE — Telephone Encounter (Signed)
05/24/2019 - PATIENT HAD HER COMPLETE PHYSICAL WITH DR. Benay Spice ON Thursday (05/24/2019). DR. Benay Spice HAS REQUESTED SHE RETURN FOR A 6 MONTH FOLLOW-UP WITH HER IN April 2021. THE PATIENT DID NOT STOP AT CHECK-OUT. I TRIED TO CALL AND SCHEDULE BUT HAD TO LEAVE HER A VOICE MAIL TO RETURN MY CALL. [place]

## 2019-05-25 DIAGNOSIS — Z23 Encounter for immunization: Secondary | ICD-10-CM | POA: Diagnosis not present

## 2019-05-28 LAB — CYTOLOGY - PAP: Diagnosis: NEGATIVE

## 2019-12-21 ENCOUNTER — Other Ambulatory Visit: Payer: Self-pay

## 2019-12-21 ENCOUNTER — Ambulatory Visit
Admission: RE | Admit: 2019-12-21 | Discharge: 2019-12-21 | Disposition: A | Discharge status: Home / Self Care | Payer: BC Managed Care – PPO | Source: Ambulatory Visit | Attending: Family Medicine | Admitting: Family Medicine

## 2019-12-21 DIAGNOSIS — Z1231 Encounter for screening mammogram for malignant neoplasm of breast: Secondary | ICD-10-CM | POA: Diagnosis not present

## 2020-04-28 ENCOUNTER — Other Ambulatory Visit: Payer: Self-pay | Admitting: Family Medicine

## 2020-04-28 DIAGNOSIS — I10 Essential (primary) hypertension: Secondary | ICD-10-CM

## 2020-04-28 MED ORDER — LISINOPRIL 10 MG PO TABS: 10.0000 mg | ORAL_TABLET | Freq: Every day | ORAL | 1 refills | Status: DC

## 2020-05-05 ENCOUNTER — Encounter: Payer: Self-pay | Admitting: Registered Nurse

## 2020-05-05 ENCOUNTER — Ambulatory Visit (INDEPENDENT_AMBULATORY_CARE_PROVIDER_SITE_OTHER): Payer: BC Managed Care – PPO | Admitting: Registered Nurse

## 2020-05-05 ENCOUNTER — Other Ambulatory Visit: Payer: Self-pay

## 2020-05-05 VITALS — BP 119/75 | HR 68 | Temp 97.7°F | Resp 18 | Ht 62.99 in | Wt 146.0 lb

## 2020-05-05 DIAGNOSIS — Z13228 Encounter for screening for other metabolic disorders: Secondary | ICD-10-CM

## 2020-05-05 DIAGNOSIS — Z23 Encounter for immunization: Secondary | ICD-10-CM | POA: Diagnosis not present

## 2020-05-05 DIAGNOSIS — I10 Essential (primary) hypertension: Secondary | ICD-10-CM

## 2020-05-05 DIAGNOSIS — Z1329 Encounter for screening for other suspected endocrine disorder: Secondary | ICD-10-CM | POA: Diagnosis not present

## 2020-05-05 DIAGNOSIS — Z130 Encounter for screening for diseases of the blood and blood-forming organs and certain disorders involving the immune mechanism: Secondary | ICD-10-CM

## 2020-05-05 LAB — CBC WITH DIFFERENTIAL/PLATELET
Basophils Absolute: 0 10*3/uL (ref 0.0–0.2)
EOS (ABSOLUTE): 0 10*3/uL (ref 0.0–0.4)
Immature Granulocytes: 0 %
Lymphs: 37 %

## 2020-05-05 NOTE — Patient Instructions (Signed)
° ° ° °  If you have lab work done today you will be contacted with your lab results within the next 2 weeks.  If you have not heard from us then please contact us. The fastest way to get your results is to register for My Chart. ° ° °IF you received an x-ray today, you will receive an invoice from [facility]. Please contact [facility] at [phone] with questions or concerns regarding your invoice.  ° °IF you received labwork today, you will receive an invoice from [facility]. Please contact [facility] at [phone] with questions or concerns regarding your invoice.  ° °Our billing staff will not be able to assist you with questions regarding bills from these companies. ° °You will be contacted with the lab results as soon as they are available. The fastest way to get your results is to activate your My Chart account. Instructions are located on the last page of this paperwork. If you have not heard from us regarding the results in 2 weeks, please contact this office. °  ° ° ° °

## 2020-05-06 ENCOUNTER — Telehealth: Payer: Self-pay

## 2020-05-06 LAB — COMPREHENSIVE METABOLIC PANEL
ALT: 12 IU/L (ref 0–32)
AST: 15 IU/L (ref 0–40)
Albumin/Globulin Ratio: 1.5 (ref 1.2–2.2)
Albumin: 4.5 g/dL (ref 3.8–4.9)
Alkaline Phosphatase: 69 IU/L (ref 44–121)
BUN/Creatinine Ratio: 17 (ref 9–23)
BUN: 12 mg/dL (ref 6–24)
Bilirubin Total: 0.5 mg/dL (ref 0.0–1.2)
CO2: 23 mmol/L (ref 20–29)
Calcium: 9.6 mg/dL (ref 8.7–10.2)
Chloride: 105 mmol/L (ref 96–106)
Creatinine, Ser: 0.69 mg/dL (ref 0.57–1.00)
GFR calc Af Amer: 113 mL/min/{1.73_m2} (ref >59)
GFR calc non Af Amer: 98 mL/min/{1.73_m2} (ref >59)
Globulin, Total: 3 g/dL (ref 1.5–4.5)
Glucose: 103 mg/dL — ABNORMAL HIGH (ref 65–99)
Potassium: 4.3 mmol/L (ref 3.5–5.2)
Sodium: 139 mmol/L (ref 134–144)
Total Protein: 7.5 g/dL (ref 6.0–8.5)

## 2020-05-06 LAB — CBC WITH DIFFERENTIAL/PLATELET
Basos: 0 %
Eos: 1 %
Hematocrit: 41.6 % (ref 34.0–46.6)
Hemoglobin: 13.9 g/dL (ref 11.1–15.9)
Immature Grans (Abs): 0 10*3/uL (ref 0.0–0.1)
Lymphocytes Absolute: 2 10*3/uL (ref 0.7–3.1)
MCH: 29 pg (ref 26.6–33.0)
MCHC: 33.4 g/dL (ref 31.5–35.7)
MCV: 87 fL (ref 79–97)
Monocytes Absolute: 0.3 10*3/uL (ref 0.1–0.9)
Monocytes: 6 %
Neutrophils Absolute: 2.9 10*3/uL (ref 1.4–7.0)
Neutrophils: 56 %
Platelets: 258 10*3/uL (ref 150–450)
RBC: 4.8 x10E6/uL (ref 3.77–5.28)
RDW: 11.9 % (ref 11.7–15.4)
WBC: 5.3 10*3/uL (ref 3.4–10.8)

## 2020-05-06 LAB — TSH: TSH: 0.845 u[IU]/mL (ref 0.450–4.500)

## 2020-05-06 LAB — LIPID PANEL
Chol/HDL Ratio: 4.7 ratio — ABNORMAL HIGH (ref 0.0–4.4)
Cholesterol, Total: 191 mg/dL (ref 100–199)
HDL: 41 mg/dL (ref >39)
LDL Chol Calc (NIH): 138 mg/dL — ABNORMAL HIGH (ref 0–99)
Triglycerides: 66 mg/dL (ref 0–149)
VLDL Cholesterol Cal: 12 mg/dL (ref 5–40)

## 2020-05-06 LAB — HEMOGLOBIN A1C
Est. average glucose Bld gHb Est-mCnc: 123 mg/dL
Hgb A1c MFr Bld: 5.9 % — ABNORMAL HIGH (ref 4.8–5.6)

## 2020-05-06 MED ORDER — CHLORTHALIDONE 25 MG PO TABS: 25.0000 mg | ORAL_TABLET | Freq: Every day | ORAL | 3 refills | Status: AC

## 2020-05-06 NOTE — Telephone Encounter (Signed)
Sent,  Thanks,  Rich

## 2020-05-06 NOTE — Progress Notes (Signed)
Letter please:  Labs are looking ok. Keep an eye on diet and exercise. I'll see you in 6 mos  Thanks,  Jari Sportsman, NP

## 2020-05-06 NOTE — Telephone Encounter (Signed)
Per recent visit patient requested to have the the new prescription for blood pressure to be sent to [facility] [place].

## 2020-05-07 ENCOUNTER — Encounter (HOSPITAL_COMMUNITY): Payer: Self-pay | Admitting: Emergency Medicine

## 2020-05-07 ENCOUNTER — Ambulatory Visit (HOSPITAL_COMMUNITY)
Admission: EM | Admit: 2020-05-07 | Discharge: 2020-05-07 | Disposition: A | Discharge status: Home / Self Care | Payer: BC Managed Care – PPO | Attending: Family Medicine | Admitting: Family Medicine

## 2020-05-07 ENCOUNTER — Other Ambulatory Visit: Payer: Self-pay

## 2020-05-07 ENCOUNTER — Encounter: Payer: Self-pay | Admitting: Radiology

## 2020-05-07 DIAGNOSIS — J3089 Other allergic rhinitis: Secondary | ICD-10-CM

## 2020-05-07 DIAGNOSIS — S46919A Strain of unspecified muscle, fascia and tendon at shoulder and upper arm level, unspecified arm, initial encounter: Secondary | ICD-10-CM | POA: Diagnosis not present

## 2020-05-07 IMAGING — MG MM BREAST BX W LOC DEV EA AD LESION IMG BX SPEC STEREO GUIDE*L*
8 of 11 series · 8 of 15 positions shown · non-contrast
Comparison: Previous exams.

ADDENDUM:
Pathology revealed FIBROCYSTIC CHANGES. PSEUDOANGIOMATOUS STROMAL
HYPERPLASIA, MICROCALCIFICATIONS of the Left breast, upper outer
quadrant, posterior calcifications. This was found to be discordant
by Dr. Sunanda Ofori, with excision recommended.

Pathology revealed FLAT EPITHELIAL ATYPIA (FOCAL),
MICROCALCIFICATIONS of the Left breast, upper outer quadrant
anterior calcifications. This was found to be concordant by Dr.
Sunanda Ofori, with excision recommended.
MICROCALCIFICATIONS of the Right breast, central calcifications.
This was found to be concordant by Dr. Sunanda Ofori, with excision
recommended.
Pathology results were discussed with the patient's husband, Pema
Representative. The patient's husband reported his wife did well
after the biopsies with tenderness at the sites. Post biopsy
instructions and care were reviewed and questions were answered. The
patient was encouraged to call [REDACTED] for any additional concerns.
Surgical consultation has been arranged with Dr. Mizusyi Frontuna at
[REDACTED] on May 16, 2018.
CLINICAL DATA: Left breast calcifications for biopsy.
EXAM:
LEFT BREAST STEREOTACTIC CORE NEEDLE BIOPSY

[L (1 of 2)]
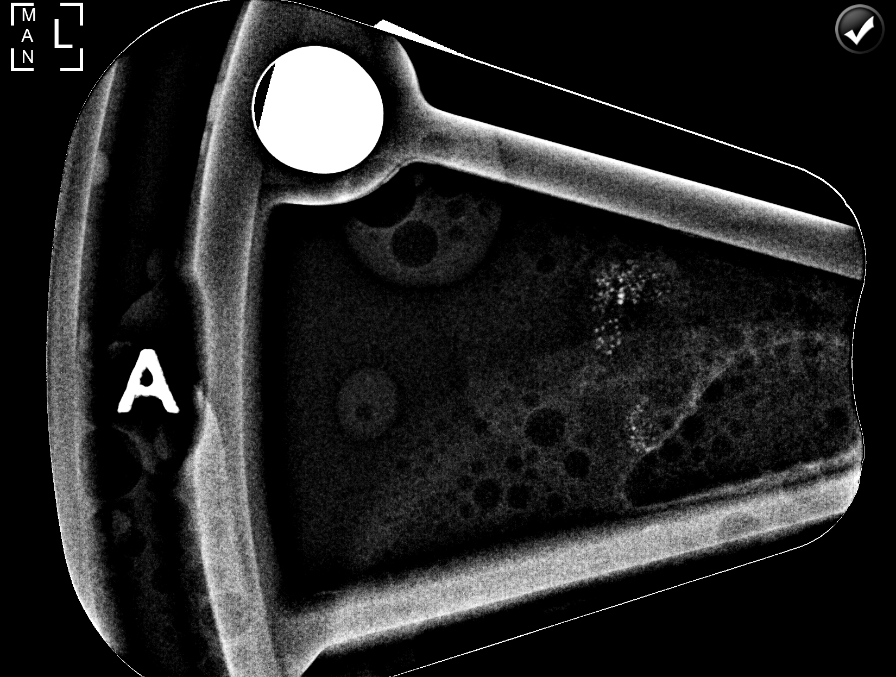

[L (2 of 2)]
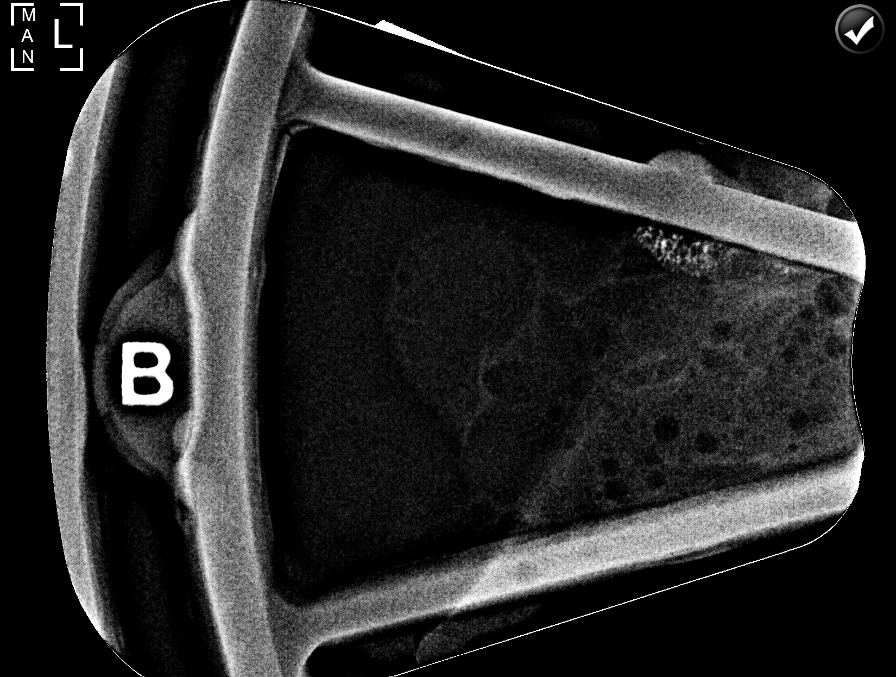

[L CC (1 of 6)]
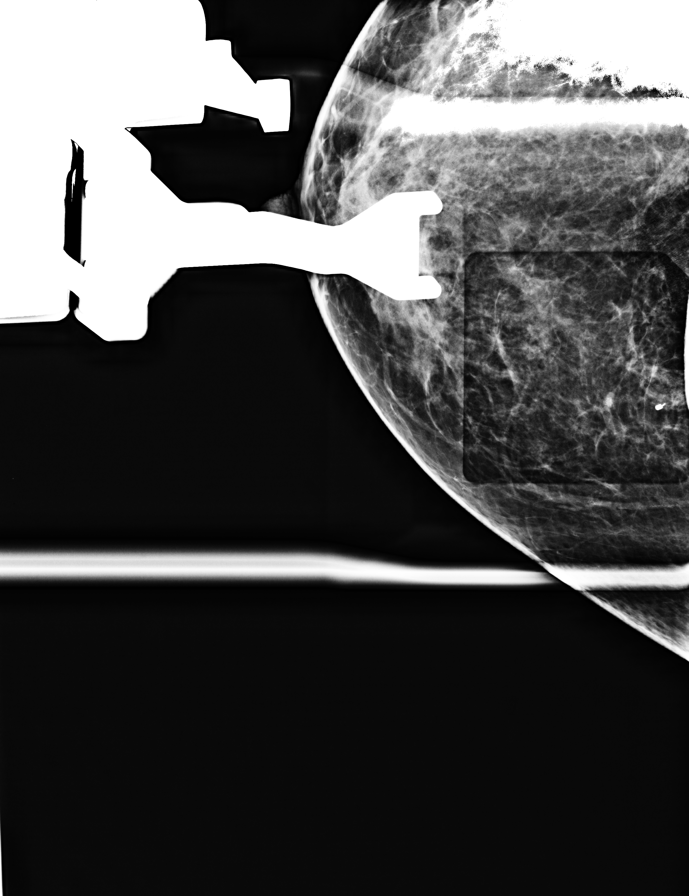

[L CC (2 of 6)]
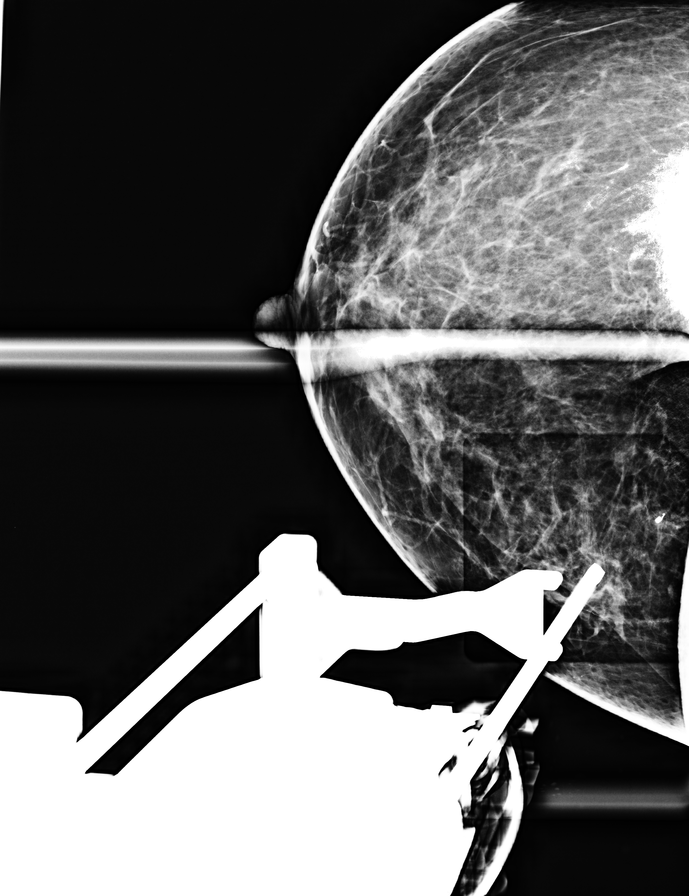

[L CC (3 of 6)]
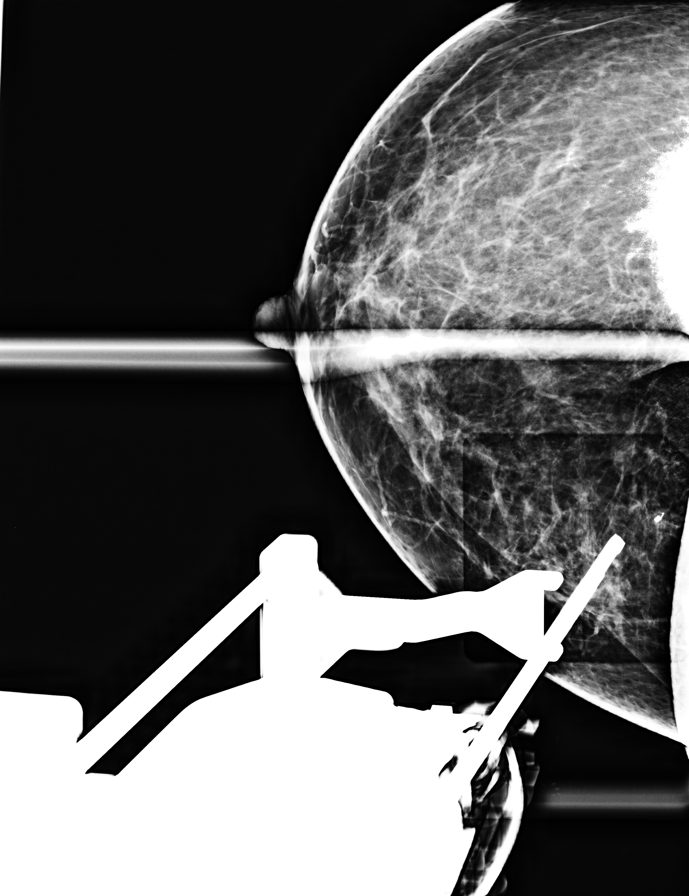

[L CC (4 of 6)]
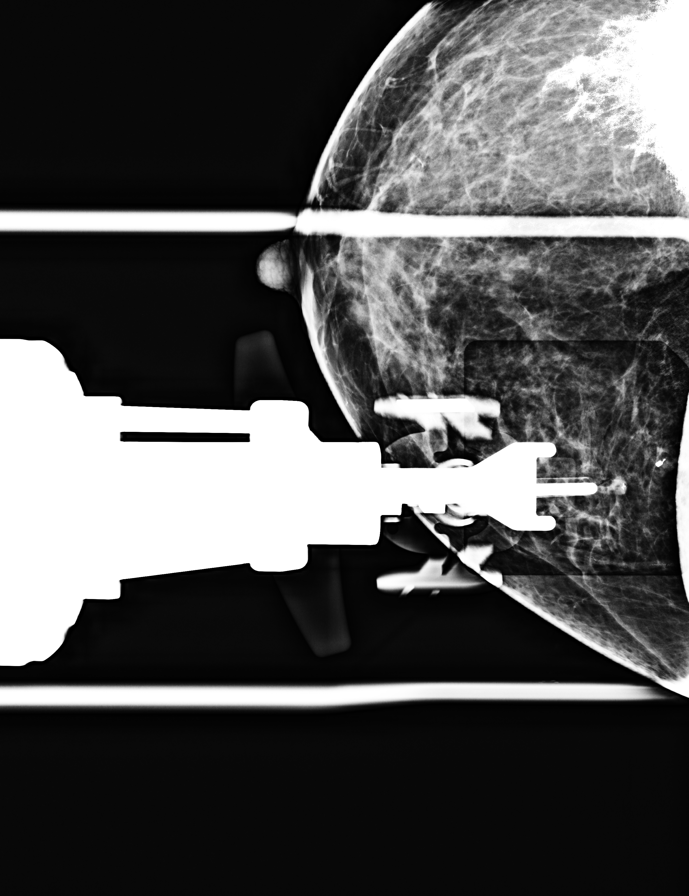

[L CC (5 of 6)]
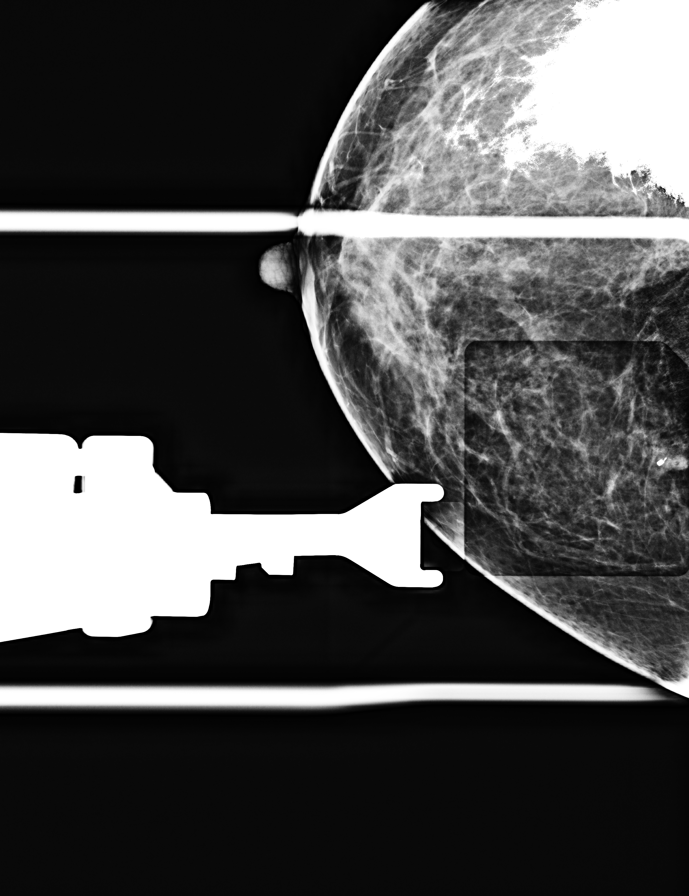

[L CC (6 of 6)]
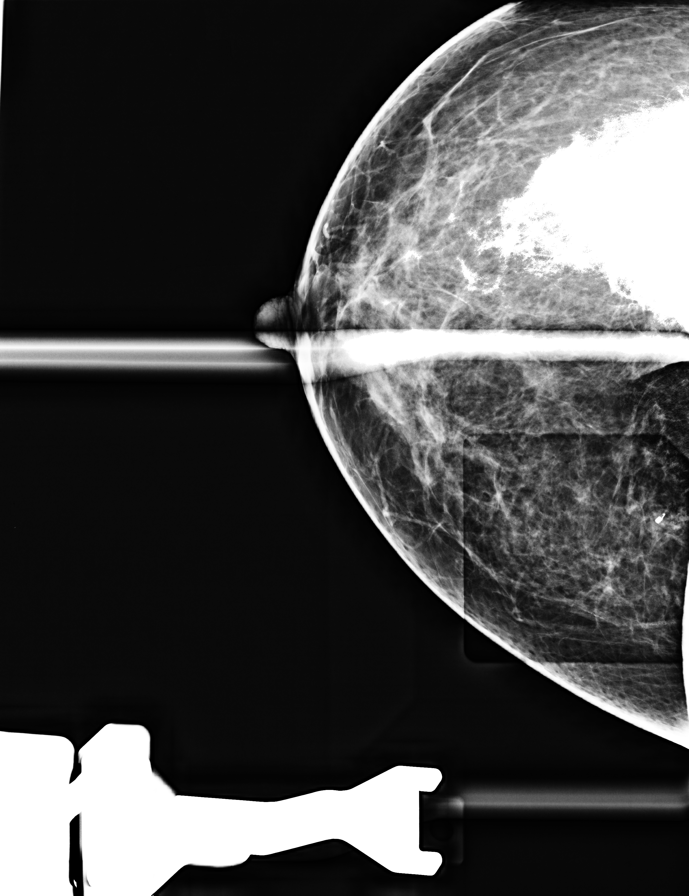

[8 of 15 positions shown; findings below may reference images not displayed]



Using sterile technique and 1% Lidocaine as local anesthetic, under
stereotactic guidance, a 9 gauge vacuum assisted device was used to
perform core needle biopsy of calcifications in the posterior
upper-outer quadrant left breast using a cranial approach. Specimen
radiograph was performed showing inclusion of calcifications of
concern. Specimens with calcifications are identified for pathology.

Lesion quadrant: Upper-outer quadrant

At the conclusion of the procedure, a coil tissue marker clip was
deployed into the biopsy cavity. Follow-up 2-view mammogram was
performed and dictated separately.

Using sterile technique and 1% Lidocaine as local anesthetic, under
stereotactic guidance, a 9 gauge vacuum assisted device was used to
perform core needle biopsy of calcifications in the anterior
upper-outer quadrant left breast using a cranial approach. Specimen
radiograph was performed showing inclusion of calcifications of
concern. Specimens with calcifications are identified for pathology.

Lesion quadrant: Upper-outer quadrant

At the conclusion of the procedure, a X tissue marker clip was
deployed into the biopsy cavity. Follow-up 2-view mammogram was
performed and dictated separately.
IMPRESSION: Stereotactic-guided biopsy of left breast. No apparent
complications.

## 2020-05-07 MED ORDER — FLUTICASONE PROPIONATE 50 MCG/ACT NA SUSP: 1.0000 | Freq: Two times a day (BID) | NASAL | 1 refills | Status: AC

## 2020-05-07 MED ORDER — IBUPROFEN 800 MG PO TABS: 800.0000 mg | ORAL_TABLET | Freq: Three times a day (TID) | ORAL | 0 refills | Status: AC | PRN

## 2020-05-07 MED ORDER — CYCLOBENZAPRINE HCL 5 MG PO TABS: 5.0000 mg | ORAL_TABLET | Freq: Three times a day (TID) | ORAL | 0 refills | Status: AC | PRN

## 2020-05-07 MED ORDER — CETIRIZINE HCL 10 MG PO TABS: 10.0000 mg | ORAL_TABLET | Freq: Every day | ORAL | 1 refills | Status: AC

## 2020-05-07 NOTE — ED Provider Notes (Signed)
[facility]    CSN: [id number] Arrival date & time: 05/07/20  1044      History   Chief Complaint Chief Complaint  Patient presents with  . Arm Pain  . Shoulder Pain  . Cough    HPI Linda Henry is a 54 y.o. female.   Medical translator was used today for visit with patient's consent. Patient presenting today with 2 weeks of hoarse voice, scratchy throat, sinus drainage, hacking cough. She is also having some b/l shoulder and upper back soreness for several days. She believes the upper respiratory sxs started at change of season and has not taken anything for these. Denies fever, chills, wheezing, CP, SOB. The soreness she states does happen from time to time after lifting heavy objects at work which she's done frequently of late. Has not taken anything for this.      Past Medical History:  Diagnosis Date  . Breast mass 2019   bilateral breasts  . Hypertension     Patient Active Problem List   Diagnosis Date Noted  . Essential hypertension, benign 05/24/2017    Past Surgical History:  Procedure Laterality Date  . ABDOMINAL HYSTERECTOMY  2013   partial, for benign resons, cervix and ovaries not removed, [place]  . BREAST LUMPECTOMY WITH RADIOACTIVE SEED LOCALIZATION Bilateral 06/08/2018   Procedure: LEFT BREAST LUMPECTOMY WITH RADIOACTIVE SEED LOCALIZATION X2 AND RIGHT BREAST LUMPECTOMY WITH RADIOACTIVE SEED LOCALIZATION;  Surgeon: Harriette Bouillon, MD;  Location: [facility];  Service: General;  Laterality: Bilateral;  PECTORAL BLOCK  . CESAREAN SECTION     x2    OB History   No obstetric history on file.      Home Medications    Prior to Admission medications   Medication Sig Start Date End Date Taking? Authorizing Provider  cetirizine (ZYRTEC ALLERGY) 10 MG tablet Take 1 tablet (10 mg total) by mouth daily. 05/07/20   Particia Nearing, PA-C  chlorthalidone (HYGROTON) 25 MG tablet Take 1 tablet (25 mg total) by  mouth daily. 05/06/20   Janeece Agee, NP  cyclobenzaprine (FLEXERIL) 5 MG tablet Take 1 tablet (5 mg total) by mouth 3 (three) times daily as needed for muscle spasms. DO NOT DRINK ALCOHOL OR DRIVE WHILE TAKING THIS MEDICATION 05/07/20   Particia Nearing, PA-C  fluticasone [facility]) 50 MCG/ACT nasal spray Place 1 spray into both nostrils 2 (two) times daily. 05/07/20   Particia Nearing, PA-C  ibuprofen (ADVIL) 800 MG tablet Take 1 tablet (800 mg total) by mouth every 8 (eight) hours as needed. 05/07/20   Particia Nearing, PA-C  lisinopril (ZESTRIL) 10 MG tablet Take 1 tablet (10 mg total) by mouth daily. 04/28/20   Myles Lipps, MD    Family History Family History  Problem Relation Age of Onset  . Healthy Mother   . Healthy Father   . Breast cancer Sister     Social History Social History   Tobacco Use  . Smoking status: Never Smoker  . Smokeless tobacco: Never Used  Substance Use Topics  . Alcohol use: No    Alcohol/week: 0.0 standard drinks  . Drug use: No     Allergies   Patient has no known allergies.   Review of Systems Review of Systems PER HPI    Physical Exam Triage Vital Signs ED Triage Vitals  Enc Vitals Group     BP 05/07/20 1148 137/80     Pulse Rate 05/07/20 1148 77  Resp 05/07/20 1148 17     Temp 05/07/20 1148 98.4 F (36.9 C)     Temp Source 05/07/20 1148 Oral     SpO2 05/07/20 1148 100 %     Weight --      Height --      Head Circumference --      Peak Flow --      Pain Score 05/07/20 1145 0     Pain Loc --      Pain Edu? --      Excl. in GC? --    No data found.  Updated Vital Signs BP 137/80 (BP Location: Right Arm)   Pulse 77   Temp 98.4 F (36.9 C) (Oral)   Resp 17   SpO2 100%   Visual Acuity Right Eye Distance:   Left Eye Distance:   Bilateral Distance:    Right Eye Near:   Left Eye Near:    Bilateral Near:     Physical Exam Vitals and nursing note reviewed.  Constitutional:       Appearance: Normal appearance. She is not ill-appearing.  HENT:     Head: Atraumatic.     Right Ear: Tympanic membrane normal.     Left Ear: Tympanic membrane normal.     Nose: Rhinorrhea present.     Mouth/Throat:     Mouth: Mucous membranes are moist.     Pharynx: Posterior oropharyngeal erythema present.  Eyes:     Extraocular Movements: Extraocular movements intact.     Conjunctiva/sclera: Conjunctivae normal.  Cardiovascular:     Rate and Rhythm: Normal rate and regular rhythm.     Heart sounds: Normal heart sounds.  Pulmonary:     Effort: Pulmonary effort is normal.     Breath sounds: Normal breath sounds.  Abdominal:     General: Bowel sounds are normal. There is no distension.     Palpations: Abdomen is soft.     Tenderness: There is no abdominal tenderness.  Musculoskeletal:        General: Tenderness (mild lateral shoulder ttp extending into upper back b/l) present. No swelling, deformity or signs of injury. Normal range of motion.     Cervical back: Normal range of motion and neck supple.  Skin:    General: Skin is warm and dry.     Findings: No erythema.  Neurological:     Mental Status: She is alert and oriented to person, place, and time.  Psychiatric:        Mood and Affect: Mood normal.        Thought Content: Thought content normal.        Judgment: Judgment normal.      UC Treatments / Results  Labs (all labs ordered are listed, but only abnormal results are displayed) Labs Reviewed - No data to display  EKG   Radiology No results found.  Procedures Procedures (including critical care time)  Medications Ordered in UC Medications - No data to display  Initial Impression / Assessment and Plan / UC Course  I have reviewed the triage vital signs and the nursing notes.  Pertinent labs & imaging results that were available during my care of the patient were reviewed by me and considered in my medical decision making (see chart for details).      Suspect allergic rhinitis causing her upper respiratory sxs. Will start zyrtec and flonase regimen for this.   Suspect muscle strain/soreness causing her shoulder pains and will treat with ibuprofen and  low dose muscle relaxers. Rest, massage, heat reviewed. F/u if worsening or not resolving.    Final Clinical Impressions(s) / UC Diagnoses   Final diagnoses:  Seasonal allergic rhinitis due to other allergic trigger  Strain of shoulder, unspecified laterality, initial encounter   Discharge Instructions   None    ED Prescriptions    Medication Sig Dispense Auth. Provider   cetirizine (ZYRTEC ALLERGY) 10 MG tablet Take 1 tablet (10 mg total) by mouth daily. 30 tablet Particia Nearing, PA-C   fluticasone [facility]) 50 MCG/ACT nasal spray Place 1 spray into both nostrils 2 (two) times daily. 16 g Particia Nearing, [place]   ibuprofen (ADVIL) 800 MG tablet Take 1 tablet (800 mg total) by mouth every 8 (eight) hours as needed. 60 tablet Particia Nearing, [place]   cyclobenzaprine (FLEXERIL) 5 MG tablet Take 1 tablet (5 mg total) by mouth 3 (three) times daily as needed for muscle spasms. DO NOT DRINK ALCOHOL OR DRIVE WHILE TAKING THIS MEDICATION 30 tablet Particia Nearing, [place]     PDMP not reviewed this encounter.   Particia Nearing, [place] 05/07/20 [phone]

## 2020-05-07 NOTE — ED Triage Notes (Signed)
Pt presents with arm and shoulder pain xs 2 weeks. Pt c/o of cough, states informed PCP of cough that seems to be related to BP medication.

## 2020-06-23 ENCOUNTER — Encounter: Payer: Self-pay | Admitting: Registered Nurse

## 2020-06-23 NOTE — Progress Notes (Signed)
Established Patient Office Visit  Subjective:  Patient ID: Linda Henry, female    DOB: 1966/02/19  Age: 54 y.o. MRN: [id number]  CC:  Chief Complaint  Patient presents with  . Follow-up    Patient is here for a follow up for medication check and and blodd pressure.    HPI Linda Henry presents for htn follow up   Hypertension: Patient Currently taking: chlorthalidone 25mg  PO qd, lisinopril 10mg  po qd Good effect. No AEs. Denies CV symptoms including: chest pain, shob, doe, headache, visual changes, fatigue, claudication, and dependent edema.   Previous readings and labs: BP Readings from Last 3 Encounters:  05/07/20 137/80  05/05/20 119/75  05/24/19 127/80   Lab Results  Component Value Date   CREATININE 0.69 05/05/2020      Past Medical History:  Diagnosis Date  . Breast mass 2019   bilateral breasts  . Hypertension     Past Surgical History:  Procedure Laterality Date  . ABDOMINAL HYSTERECTOMY  2013   partial, for benign resons, cervix and ovaries not removed, 2020  . BREAST LUMPECTOMY WITH RADIOACTIVE SEED LOCALIZATION Bilateral 06/08/2018   Procedure: LEFT BREAST LUMPECTOMY WITH RADIOACTIVE SEED LOCALIZATION X2 AND RIGHT BREAST LUMPECTOMY WITH RADIOACTIVE SEED LOCALIZATION;  Surgeon: [place], MD;  Location: [facility];  Service: General;  Laterality: Bilateral;  PECTORAL BLOCK  . CESAREAN SECTION     x2    Family History  Problem Relation Age of Onset  . Healthy Mother   . Healthy Father   . Breast cancer Sister     Social History   Socioeconomic History  . Marital status: Married    Spouse name: Not on file  . Number of children: Not on file  . Years of education: Not on file  . Highest education level: Not on file  Occupational History  . Not on file  Tobacco Use  . Smoking status: Never Smoker  . Smokeless tobacco: Never Used  Substance and Sexual Activity  . Alcohol use: No     Alcohol/week: 0.0 standard drinks  . Drug use: No  . Sexual activity: Yes    Birth control/protection: Surgical  Other Topics Concern  . Not on file  Social History Narrative  . Not on file   Social Determinants of Health   Financial Resource Strain:   . Difficulty of Paying Living Expenses: Not on file  Food Insecurity:   . Worried About 06/10/2018 in the Last Year: Not on file  . Ran Out of Food in the Last Year: Not on file  Transportation Needs:   . Lack of Transportation (Medical): Not on file  . Lack of Transportation (Non-Medical): Not on file  Physical Activity:   . Days of Exercise per Week: Not on file  . Minutes of Exercise per Session: Not on file  Stress:   . Feeling of Stress : Not on file  Social Connections:   . Frequency of Communication with Friends and Family: Not on file  . Frequency of Social Gatherings with Friends and Family: Not on file  . Attends Religious Services: Not on file  . Active Member of Clubs or Organizations: Not on file  . Attends Harriette Bouillon Meetings: Not on file  . Marital Status: Not on file  Intimate Partner Violence:   . Fear of Current or Ex-Partner: Not on file  . Emotionally Abused: Not on file  . Physically Abused: Not on file  .  Sexually Abused: Not on file    Outpatient Medications Prior to Visit  Medication Sig Dispense Refill  . lisinopril (ZESTRIL) 10 MG tablet Take 1 tablet (10 mg total) by mouth daily. 30 tablet 1   No facility-administered medications prior to visit.    No Known Allergies  ROS Review of Systems  Constitutional: Negative.   HENT: Negative.   Eyes: Negative.   Respiratory: Negative.   Cardiovascular: Negative.   Gastrointestinal: Negative.   Genitourinary: Negative.   Musculoskeletal: Negative.   Skin: Negative.   Neurological: Negative.   Psychiatric/Behavioral: Negative.       Objective:    Physical Exam Vitals and nursing note reviewed.  Constitutional:       General: She is not in acute distress.    Appearance: Normal appearance. She is normal weight. She is not ill-appearing, toxic-appearing or diaphoretic.  Cardiovascular:     Rate and Rhythm: Normal rate and regular rhythm.     Heart sounds: Normal heart sounds. No murmur heard.  No friction rub. No gallop.   Pulmonary:     Effort: Pulmonary effort is normal. No respiratory distress.     Breath sounds: Normal breath sounds. No stridor. No wheezing, rhonchi or rales.  Chest:     Chest wall: No tenderness.  Skin:    General: Skin is warm and dry.  Neurological:     General: No focal deficit present.     Mental Status: She is alert and oriented to person, place, and time. Mental status is at baseline.  Psychiatric:        Mood and Affect: Mood normal.        Behavior: Behavior normal.        Thought Content: Thought content normal.        Judgment: Judgment normal.     BP 119/75   Pulse 68   Temp 97.7 F (36.5 C) (Temporal)   Resp 18   Ht 5' 2.99" (1.6 m)   Wt 146 lb (66.2 kg)   SpO2 96%   BMI 25.87 kg/m  Wt Readings from Last 3 Encounters:  05/05/20 146 lb (66.2 kg)  05/24/19 143 lb 9.6 oz (65.1 kg)  04/26/19 148 lb 3.2 oz (67.2 kg)     Health Maintenance Due  Topic Date Due  . Hepatitis C Screening  Never done  . COVID-19 Vaccine (1) Never done    There are no preventive care reminders to display for this patient.  Lab Results  Component Value Date   TSH 0.845 05/05/2020   Lab Results  Component Value Date   WBC 5.3 05/05/2020   HGB 13.9 05/05/2020   HCT 41.6 05/05/2020   MCV 87 05/05/2020   PLT 258 05/05/2020   Lab Results  Component Value Date   NA 139 05/05/2020   K 4.3 05/05/2020   CO2 23 05/05/2020   GLUCOSE 103 (H) 05/05/2020   BUN 12 05/05/2020   CREATININE 0.69 05/05/2020   BILITOT 0.5 05/05/2020   ALKPHOS 69 05/05/2020   AST 15 05/05/2020   ALT 12 05/05/2020   PROT 7.5 05/05/2020   ALBUMIN 4.5 05/05/2020   CALCIUM 9.6 05/05/2020    Lab Results  Component Value Date   CHOL 191 05/05/2020   Lab Results  Component Value Date   HDL 41 05/05/2020   Lab Results  Component Value Date   LDLCALC 138 (H) 05/05/2020   Lab Results  Component Value Date   TRIG 66 05/05/2020   Lab  Results  Component Value Date   CHOLHDL 4.7 (H) 05/05/2020   Lab Results  Component Value Date   HGBA1C 5.9 (H) 05/05/2020      Assessment & Plan:   Problem List Items Addressed This Visit      Cardiovascular and Mediastinum   Essential hypertension, benign - Primary   Relevant Medications   chlorthalidone (HYGROTON) 25 MG tablet   Other Relevant Orders   TSH (Completed)   Lipid panel (Completed)   CBC with Differential (Completed)   Comprehensive metabolic panel (Completed)   Hemoglobin A1c (Completed)    Other Visit Diagnoses    Flu vaccine need       Relevant Orders   Flu Vaccine QUAD 6+ mos PF IM (Fluarix Quad PF) (Completed)   Screening for endocrine, metabolic and immunity disorder       Relevant Orders   TSH (Completed)   Comprehensive metabolic panel (Completed)   Hemoglobin A1c (Completed)      Meds ordered this encounter  Medications  . chlorthalidone (HYGROTON) 25 MG tablet    Sig: Take 1 tablet (25 mg total) by mouth daily.    Dispense:  90 tablet    Refill:  3    Order Specific Question:   Supervising Provider    Answer:   Neva Seat, JEFFREY R [[id number]]    Follow-up: No follow-ups on file.   PLAN  Refill meds  bp well controlled  Labs collected. Will follow up with the patient as warranted.  Patient encouraged to call clinic with any questions, comments, or concerns.  Janeece Agee, NP

## 2020-11-03 ENCOUNTER — Encounter: Payer: Self-pay | Admitting: Registered Nurse

## 2020-11-04 ENCOUNTER — Encounter: Payer: BC Managed Care – PPO | Admitting: Registered Nurse

## 2020-11-04 DIAGNOSIS — Z0289 Encounter for other administrative examinations: Secondary | ICD-10-CM

## 2020-11-12 ENCOUNTER — Other Ambulatory Visit: Payer: Self-pay | Admitting: Emergency Medicine

## 2020-11-12 DIAGNOSIS — Z1231 Encounter for screening mammogram for malignant neoplasm of breast: Secondary | ICD-10-CM

## 2021-01-05 ENCOUNTER — Other Ambulatory Visit: Payer: Self-pay

## 2021-01-05 ENCOUNTER — Ambulatory Visit
Admission: RE | Admit: 2021-01-05 | Discharge: 2021-01-05 | Disposition: A | Discharge status: Home / Self Care | Payer: BC Managed Care – PPO | Source: Ambulatory Visit | Attending: Emergency Medicine | Admitting: Emergency Medicine

## 2021-01-05 DIAGNOSIS — Z1231 Encounter for screening mammogram for malignant neoplasm of breast: Secondary | ICD-10-CM

## 2021-01-08 NOTE — Progress Notes (Signed)
Call patient please.  Normal mammogram.  Thanks.

## 2021-02-19 DIAGNOSIS — U071 COVID-19: Secondary | ICD-10-CM | POA: Diagnosis not present

## 2021-02-20 DIAGNOSIS — U071 COVID-19: Secondary | ICD-10-CM | POA: Diagnosis not present

## 2021-05-05 ENCOUNTER — Encounter (HOSPITAL_COMMUNITY): Payer: Self-pay | Admitting: Emergency Medicine

## 2021-05-05 ENCOUNTER — Ambulatory Visit (HOSPITAL_COMMUNITY)
Admission: EM | Admit: 2021-05-05 | Discharge: 2021-05-05 | Disposition: A | Discharge status: Home / Self Care | Payer: BC Managed Care – PPO | Attending: Urgent Care | Admitting: Urgent Care

## 2021-05-05 ENCOUNTER — Other Ambulatory Visit: Payer: Self-pay

## 2021-05-05 DIAGNOSIS — Z760 Encounter for issue of repeat prescription: Secondary | ICD-10-CM

## 2021-05-05 DIAGNOSIS — R030 Elevated blood-pressure reading, without diagnosis of hypertension: Secondary | ICD-10-CM

## 2021-05-05 DIAGNOSIS — I10 Essential (primary) hypertension: Secondary | ICD-10-CM

## 2021-05-05 MED ORDER — AMLODIPINE BESYLATE 5 MG PO TABS: 5.0000 mg | ORAL_TABLET | Freq: Every day | ORAL | 0 refills | Status: AC

## 2021-05-05 NOTE — ED Triage Notes (Signed)
Pt reports has HTN and ran out today. Doctor at clinic that was seeing is no longer there.  Chlorthalidone 25mg  is medication need refilled.

## 2021-05-05 NOTE — ED Provider Notes (Signed)
[name]   MRN: [id number] DOB: 09-16-1965  Subjective:   Linda Henry is a 55 y.o. female presenting for medication refill for her HTN. Was lost to follow up since her PCP at [facility] closed. Was taking lisinopril, then switched off that due to chronic cough. Has been using chlorthalidone but would like to try a different one since the previous provider only saw her once through [place] as it was transitioning. Denies headache, confusion, weakness, chest pain, shortness of breath, n/v, abdominal pain, numbness or tingling.    No current facility-administered medications for this encounter.  Current Outpatient Medications:    cetirizine (ZYRTEC ALLERGY) 10 MG tablet, Take 1 tablet (10 mg total) by mouth daily., Disp: 30 tablet, Rfl: 1   chlorthalidone (HYGROTON) 25 MG tablet, Take 1 tablet (25 mg total) by mouth daily., Disp: 90 tablet, Rfl: 3   cyclobenzaprine (FLEXERIL) 5 MG tablet, Take 1 tablet (5 mg total) by mouth 3 (three) times daily as needed for muscle spasms. DO NOT DRINK ALCOHOL OR DRIVE WHILE TAKING THIS MEDICATION, Disp: 30 tablet, Rfl: 0   fluticasone (FLONASE) 50 MCG/ACT nasal spray, Place 1 spray into both nostrils 2 (two) times daily., Disp: 16 g, Rfl: 1   ibuprofen (ADVIL) 800 MG tablet, Take 1 tablet (800 mg total) by mouth every 8 (eight) hours as needed., Disp: 60 tablet, Rfl: 0   lisinopril (ZESTRIL) 10 MG tablet, Take 1 tablet (10 mg total) by mouth daily., Disp: 30 tablet, Rfl: 1   No Known Allergies  Past Medical History:  Diagnosis Date   Breast mass 2019   bilateral breasts   Hypertension      Past Surgical History:  Procedure Laterality Date   ABDOMINAL HYSTERECTOMY  2013   partial, for benign resons, cervix and ovaries not removed, [place]   BREAST EXCISIONAL BIOPSY     BREAST LUMPECTOMY WITH RADIOACTIVE SEED LOCALIZATION Bilateral 06/08/2018   Procedure: LEFT BREAST LUMPECTOMY WITH RADIOACTIVE SEED LOCALIZATION X2 AND  RIGHT BREAST LUMPECTOMY WITH RADIOACTIVE SEED LOCALIZATION;  Surgeon: Harriette Bouillon, MD;  Location: [facility];  Service: General;  Laterality: Bilateral;  PECTORAL BLOCK   CESAREAN SECTION     x2    Family History  Problem Relation Age of Onset   Healthy Mother    Healthy Father    Breast cancer Sister     Social History   Tobacco Use   Smoking status: Never   Smokeless tobacco: Never  Substance Use Topics   Alcohol use: No    Alcohol/week: 0.0 standard drinks   Drug use: No    ROS   Objective:   Vitals: BP (!) 147/91 (BP Location: Left Arm)   Pulse 75   Temp 98.1 F (36.7 C) (Oral)   Resp 18   SpO2 100%   BP Readings from Last 3 Encounters:  05/05/21 (!) 147/91  05/07/20 137/80  05/05/20 119/75    Physical Exam Constitutional:      General: She is not in acute distress.    Appearance: Normal appearance. She is well-developed. She is not ill-appearing, toxic-appearing or diaphoretic.  HENT:     Head: Normocephalic and atraumatic.     Nose: Nose normal.     Mouth/Throat:     Mouth: Mucous membranes are moist.  Eyes:     Extraocular Movements: Extraocular movements intact.     Pupils: Pupils are equal, round, and reactive to light.  Cardiovascular:     Rate and  Rhythm: Normal rate and regular rhythm.     Pulses: Normal pulses.     Heart sounds: Normal heart sounds. No murmur heard.   No friction rub. No gallop.  Pulmonary:     Effort: Pulmonary effort is normal. No respiratory distress.     Breath sounds: Normal breath sounds. No stridor. No wheezing, rhonchi or rales.  Skin:    General: Skin is warm and dry.     Findings: No rash.  Neurological:     Mental Status: She is alert and oriented to person, place, and time.     Cranial Nerves: No cranial nerve deficit.     Comments: Negative Romberg and pronator drift.  Psychiatric:        Mood and Affect: Mood normal.        Behavior: Behavior normal.        Thought Content: Thought  content normal.        Judgment: Judgment normal.     Assessment and Plan :   PDMP not reviewed this encounter.  1. Essential hypertension   2. Elevated blood pressure reading     Stopped over that were done.  Switch to amlodipine.  Counseled on general management of hypertension.  Recommended she try to establish care with a new PCP, Dr. de [place]. Counseled patient on potential for adverse effects with medications prescribed/recommended today, ER and return-to-clinic precautions discussed, patient verbalized understanding.    Wallis Bamberg, PA-C 05/05/21 1550

## 2021-08-04 DIAGNOSIS — I10 Essential (primary) hypertension: Secondary | ICD-10-CM | POA: Diagnosis not present

## 2021-08-31 DIAGNOSIS — Z124 Encounter for screening for malignant neoplasm of cervix: Secondary | ICD-10-CM | POA: Diagnosis not present

## 2021-08-31 DIAGNOSIS — Z0000 Encounter for general adult medical examination without abnormal findings: Secondary | ICD-10-CM | POA: Diagnosis not present

## 2021-08-31 DIAGNOSIS — Z1211 Encounter for screening for malignant neoplasm of colon: Secondary | ICD-10-CM | POA: Diagnosis not present

## 2022-01-25 DIAGNOSIS — J302 Other seasonal allergic rhinitis: Secondary | ICD-10-CM | POA: Diagnosis not present

## 2022-01-25 DIAGNOSIS — B360 Pityriasis versicolor: Secondary | ICD-10-CM | POA: Diagnosis not present

## 2022-01-25 DIAGNOSIS — I10 Essential (primary) hypertension: Secondary | ICD-10-CM | POA: Diagnosis not present

## 2022-03-02 DIAGNOSIS — B360 Pityriasis versicolor: Secondary | ICD-10-CM | POA: Diagnosis not present

## 2022-04-16 DIAGNOSIS — Z23 Encounter for immunization: Secondary | ICD-10-CM | POA: Diagnosis not present

## 2023-06-28 ENCOUNTER — Other Ambulatory Visit: Payer: Self-pay | Admitting: Family Medicine

## 2023-06-28 DIAGNOSIS — Z0000 Encounter for general adult medical examination without abnormal findings: Secondary | ICD-10-CM

## 2023-07-26 ENCOUNTER — Ambulatory Visit
Admission: RE | Admit: 2023-07-26 | Discharge: 2023-07-26 | Disposition: A | Discharge status: Home / Self Care | Payer: BC Managed Care – PPO | Source: Ambulatory Visit | Attending: Family Medicine

## 2023-07-26 DIAGNOSIS — Z0000 Encounter for general adult medical examination without abnormal findings: Secondary | ICD-10-CM

## 2023-10-20 ENCOUNTER — Encounter: Payer: BC Managed Care – PPO | Admitting: Family Medicine

## 2024-07-05 ENCOUNTER — Other Ambulatory Visit: Payer: Self-pay | Admitting: Internal Medicine

## 2024-07-05 DIAGNOSIS — Z1231 Encounter for screening mammogram for malignant neoplasm of breast: Secondary | ICD-10-CM

## 2024-08-23 ENCOUNTER — Ambulatory Visit
Admission: RE | Admit: 2024-08-23 | Discharge: 2024-08-23 | Disposition: A | Discharge status: Home / Self Care | Source: Ambulatory Visit | Attending: Internal Medicine | Admitting: Internal Medicine

## 2024-08-23 DIAGNOSIS — Z1231 Encounter for screening mammogram for malignant neoplasm of breast: Secondary | ICD-10-CM
